# Patient Record
Sex: Male | Born: 1940 | Race: White | Hispanic: No | Smoking: Former smoker
Health system: Southern US, Community
[De-identification: ages and names within clinical notes are randomized; demographics above are authoritative.]

## PROBLEM LIST (undated history)

## (undated) DIAGNOSIS — C8590 Non-Hodgkin lymphoma, unspecified, unspecified site: Secondary | ICD-10-CM

## (undated) DIAGNOSIS — H409 Unspecified glaucoma: Secondary | ICD-10-CM

## (undated) DIAGNOSIS — N529 Male erectile dysfunction, unspecified: Secondary | ICD-10-CM

## (undated) DIAGNOSIS — N401 Enlarged prostate with lower urinary tract symptoms: Secondary | ICD-10-CM

## (undated) DIAGNOSIS — E291 Testicular hypofunction: Secondary | ICD-10-CM

## (undated) DIAGNOSIS — C61 Malignant neoplasm of prostate: Secondary | ICD-10-CM

## (undated) DIAGNOSIS — Z87438 Personal history of other diseases of male genital organs: Secondary | ICD-10-CM

## (undated) HISTORY — PX: PROSTATE BIOPSY: SHX241

## (undated) HISTORY — PX: HERNIA REPAIR: SHX51

## (undated) HISTORY — PX: OTHER SURGICAL HISTORY: SHX169

## (undated) HISTORY — PX: CATARACT EXTRACTION W/ INTRAOCULAR LENS  IMPLANT, BILATERAL: SHX1307

---

## 2005-08-13 ENCOUNTER — Encounter: Admission: RE | Admit: 2005-08-13 | Discharge: 2005-08-13 | Payer: Self-pay | Admitting: Family Medicine

## 2007-08-05 ENCOUNTER — Encounter: Admission: RE | Admit: 2007-08-05 | Discharge: 2007-08-05 | Payer: Self-pay | Admitting: Family Medicine

## 2007-08-07 ENCOUNTER — Ambulatory Visit: Payer: Self-pay | Admitting: Oncology

## 2007-08-11 LAB — COMPREHENSIVE METABOLIC PANEL
ALT: 23 U/L (ref 0–53)
AST: 27 U/L (ref 0–37)
Alkaline Phosphatase: 64 U/L (ref 39–117)
BUN: 15 mg/dL (ref 6–23)
Calcium: 9.6 mg/dL (ref 8.4–10.5)
Chloride: 100 mEq/L (ref 96–112)
Creatinine, Ser: 1.01 mg/dL (ref 0.40–1.50)
Total Bilirubin: 0.5 mg/dL (ref 0.3–1.2)

## 2007-08-11 LAB — CBC WITH DIFFERENTIAL/PLATELET
BASO%: 0.2 % (ref 0.0–2.0)
HCT: 39.3 % (ref 38.7–49.9)
HGB: 13.9 g/dL (ref 13.0–17.1)
MCHC: 35.5 g/dL (ref 32.0–35.9)
MONO#: 0.9 10*3/uL (ref 0.1–0.9)
NEUT%: 66.8 % (ref 40.0–75.0)
WBC: 6.9 10*3/uL (ref 4.0–10.0)
lymph#: 1.2 10*3/uL (ref 0.9–3.3)

## 2007-08-12 ENCOUNTER — Ambulatory Visit (HOSPITAL_COMMUNITY): Admission: RE | Admit: 2007-08-12 | Discharge: 2007-08-12 | Payer: Self-pay | Admitting: Family Medicine

## 2007-08-13 ENCOUNTER — Ambulatory Visit (HOSPITAL_COMMUNITY): Admission: RE | Admit: 2007-08-13 | Discharge: 2007-08-13 | Payer: Self-pay | Admitting: Surgery

## 2007-08-13 ENCOUNTER — Encounter (INDEPENDENT_AMBULATORY_CARE_PROVIDER_SITE_OTHER): Payer: Self-pay | Admitting: Surgery

## 2007-08-13 ENCOUNTER — Encounter (INDEPENDENT_AMBULATORY_CARE_PROVIDER_SITE_OTHER): Payer: Self-pay | Admitting: Interventional Radiology

## 2007-08-20 ENCOUNTER — Encounter (HOSPITAL_COMMUNITY): Payer: Self-pay | Admitting: Oncology

## 2007-08-20 ENCOUNTER — Ambulatory Visit: Admission: RE | Admit: 2007-08-20 | Discharge: 2007-08-20 | Payer: Self-pay | Admitting: Oncology

## 2007-08-20 ENCOUNTER — Ambulatory Visit: Payer: Self-pay | Admitting: Cardiology

## 2007-08-20 HISTORY — PX: TRANSTHORACIC ECHOCARDIOGRAM: SHX275

## 2007-08-22 ENCOUNTER — Ambulatory Visit (HOSPITAL_BASED_OUTPATIENT_CLINIC_OR_DEPARTMENT_OTHER): Admission: RE | Admit: 2007-08-22 | Discharge: 2007-08-22 | Payer: Self-pay | Admitting: Surgery

## 2007-08-26 ENCOUNTER — Other Ambulatory Visit: Admission: RE | Admit: 2007-08-26 | Discharge: 2007-08-26 | Payer: Self-pay | Admitting: Oncology

## 2007-08-26 ENCOUNTER — Encounter (HOSPITAL_COMMUNITY): Payer: Self-pay | Admitting: Oncology

## 2007-08-26 LAB — COMPREHENSIVE METABOLIC PANEL
Albumin: 3.6 g/dL (ref 3.5–5.2)
BUN: 16 mg/dL (ref 6–23)
CO2: 21 mEq/L (ref 19–32)
Glucose, Bld: 130 mg/dL — ABNORMAL HIGH (ref 70–99)
Potassium: 4.3 mEq/L (ref 3.5–5.3)
Sodium: 138 mEq/L (ref 135–145)
Total Protein: 6.4 g/dL (ref 6.0–8.3)

## 2007-08-26 LAB — URIC ACID: Uric Acid, Serum: 8.2 mg/dL — ABNORMAL HIGH (ref 2.4–7.0)

## 2007-08-26 LAB — CBC WITH DIFFERENTIAL/PLATELET
Basophils Absolute: 0 10*3/uL (ref 0.0–0.1)
Eosinophils Absolute: 0.2 10*3/uL (ref 0.0–0.5)
HGB: 13.6 g/dL (ref 13.0–17.1)
LYMPH%: 10.3 % — ABNORMAL LOW (ref 14.0–48.0)
MONO#: 1.4 10*3/uL — ABNORMAL HIGH (ref 0.1–0.9)
NEUT#: 7.9 10*3/uL — ABNORMAL HIGH (ref 1.5–6.5)
Platelets: 354 10*3/uL (ref 145–400)
RBC: 4.37 10*6/uL (ref 4.20–5.71)
WBC: 10.6 10*3/uL — ABNORMAL HIGH (ref 4.0–10.0)

## 2007-08-26 LAB — LACTATE DEHYDROGENASE: LDH: 346 U/L — ABNORMAL HIGH (ref 94–250)

## 2007-09-02 LAB — CBC WITH DIFFERENTIAL/PLATELET
BASO%: 0.1 % (ref 0.0–2.0)
EOS%: 1.7 % (ref 0.0–7.0)
MCH: 30.8 pg (ref 28.0–33.4)
MCHC: 35 g/dL (ref 32.0–35.9)
MCV: 88 fL (ref 81.6–98.0)
MONO%: 0.3 % (ref 0.0–13.0)
NEUT%: 90.2 % — ABNORMAL HIGH (ref 40.0–75.0)
RDW: 13.7 % (ref 11.2–14.6)
lymph#: 0.8 10*3/uL — ABNORMAL LOW (ref 0.9–3.3)

## 2007-09-08 LAB — CBC WITH DIFFERENTIAL/PLATELET
Basophils Absolute: 0 10*3/uL (ref 0.0–0.1)
EOS%: 0.6 % (ref 0.0–7.0)
HGB: 13.1 g/dL (ref 13.0–17.1)
MCH: 31 pg (ref 28.0–33.4)
MCV: 88.4 fL (ref 81.6–98.0)
MONO%: 1.5 % (ref 0.0–13.0)
NEUT#: 13.3 10*3/uL — ABNORMAL HIGH (ref 1.5–6.5)
RBC: 4.21 10*6/uL (ref 4.20–5.71)
RDW: 14.1 % (ref 11.2–14.6)
lymph#: 3 10*3/uL (ref 0.9–3.3)

## 2007-09-08 LAB — COMPREHENSIVE METABOLIC PANEL
ALT: 18 U/L (ref 0–53)
AST: 17 U/L (ref 0–37)
Albumin: 3.7 g/dL (ref 3.5–5.2)
Alkaline Phosphatase: 84 U/L (ref 39–117)
BUN: 11 mg/dL (ref 6–23)
Calcium: 9.5 mg/dL (ref 8.4–10.5)
Chloride: 102 mEq/L (ref 96–112)
Potassium: 3.8 mEq/L (ref 3.5–5.3)
Sodium: 139 mEq/L (ref 135–145)
Total Protein: 6.2 g/dL (ref 6.0–8.3)

## 2007-09-16 LAB — CBC WITH DIFFERENTIAL/PLATELET
Basophils Absolute: 0 10*3/uL (ref 0.0–0.1)
Eosinophils Absolute: 0 10*3/uL (ref 0.0–0.5)
HGB: 11.8 g/dL — ABNORMAL LOW (ref 13.0–17.1)
LYMPH%: 12.5 % — ABNORMAL LOW (ref 14.0–48.0)
MCV: 87.5 fL (ref 81.6–98.0)
MONO%: 2 % (ref 0.0–13.0)
NEUT#: 3.2 10*3/uL (ref 1.5–6.5)
Platelets: 120 10*3/uL — ABNORMAL LOW (ref 145–400)

## 2007-09-19 ENCOUNTER — Ambulatory Visit: Payer: Self-pay | Admitting: Oncology

## 2007-09-22 LAB — CBC WITH DIFFERENTIAL/PLATELET
Eosinophils Absolute: 0.1 10*3/uL (ref 0.0–0.5)
LYMPH%: 9.3 % — ABNORMAL LOW (ref 14.0–48.0)
MCHC: 34.5 g/dL (ref 32.0–35.9)
MCV: 88 fL (ref 81.6–98.0)
MONO%: 12.8 % (ref 0.0–13.0)
NEUT#: 7 10*3/uL — ABNORMAL HIGH (ref 1.5–6.5)
NEUT%: 77 % — ABNORMAL HIGH (ref 40.0–75.0)
Platelets: 230 10*3/uL (ref 145–400)
RBC: 4.1 10*6/uL — ABNORMAL LOW (ref 4.20–5.71)

## 2007-09-22 LAB — COMPREHENSIVE METABOLIC PANEL
Alkaline Phosphatase: 68 U/L (ref 39–117)
BUN: 10 mg/dL (ref 6–23)
Creatinine, Ser: 0.82 mg/dL (ref 0.40–1.50)
Glucose, Bld: 106 mg/dL — ABNORMAL HIGH (ref 70–99)
Sodium: 141 mEq/L (ref 135–145)
Total Bilirubin: 0.3 mg/dL (ref 0.3–1.2)
Total Protein: 6.1 g/dL (ref 6.0–8.3)

## 2007-09-22 LAB — URIC ACID: Uric Acid, Serum: 4.3 mg/dL (ref 2.4–7.0)

## 2007-09-30 LAB — CBC WITH DIFFERENTIAL/PLATELET
BASO%: 1 % (ref 0.0–2.0)
LYMPH%: 6.4 % — ABNORMAL LOW (ref 14.0–48.0)
MCHC: 34.9 g/dL (ref 32.0–35.9)
MCV: 87.6 fL (ref 81.6–98.0)
MONO%: 1.6 % (ref 0.0–13.0)
Platelets: 158 10*3/uL (ref 145–400)
RBC: 3.66 10*6/uL — ABNORMAL LOW (ref 4.20–5.71)
RDW: 15.5 % — ABNORMAL HIGH (ref 11.2–14.6)
WBC: 6.9 10*3/uL (ref 4.0–10.0)

## 2007-10-06 LAB — COMPREHENSIVE METABOLIC PANEL
ALT: 19 U/L (ref 0–53)
Albumin: 4 g/dL (ref 3.5–5.2)
CO2: 27 mEq/L (ref 19–32)
Chloride: 104 mEq/L (ref 96–112)
Potassium: 3.8 mEq/L (ref 3.5–5.3)
Sodium: 141 mEq/L (ref 135–145)
Total Bilirubin: 0.2 mg/dL — ABNORMAL LOW (ref 0.3–1.2)
Total Protein: 6.3 g/dL (ref 6.0–8.3)

## 2007-10-06 LAB — CBC WITH DIFFERENTIAL/PLATELET
BASO%: 0.4 % (ref 0.0–2.0)
Eosinophils Absolute: 0.1 10*3/uL (ref 0.0–0.5)
MCHC: 34.5 g/dL (ref 32.0–35.9)
MONO#: 1.9 10*3/uL — ABNORMAL HIGH (ref 0.1–0.9)
NEUT#: 12.2 10*3/uL — ABNORMAL HIGH (ref 1.5–6.5)
RBC: 4.09 10*6/uL — ABNORMAL LOW (ref 4.20–5.71)
RDW: 16.6 % — ABNORMAL HIGH (ref 11.2–14.6)
WBC: 14.9 10*3/uL — ABNORMAL HIGH (ref 4.0–10.0)
lymph#: 0.7 10*3/uL — ABNORMAL LOW (ref 0.9–3.3)

## 2007-10-06 LAB — LACTATE DEHYDROGENASE: LDH: 192 U/L (ref 94–250)

## 2007-10-13 LAB — CBC WITH DIFFERENTIAL/PLATELET
BASO%: 0.1 % (ref 0.0–2.0)
EOS%: 0.2 % (ref 0.0–7.0)
HCT: 34 % — ABNORMAL LOW (ref 38.7–49.9)
LYMPH%: 2.9 % — ABNORMAL LOW (ref 14.0–48.0)
MCH: 30.5 pg (ref 28.0–33.4)
MCHC: 34.9 g/dL (ref 32.0–35.9)
NEUT%: 96.3 % — ABNORMAL HIGH (ref 40.0–75.0)
RBC: 3.89 10*6/uL — ABNORMAL LOW (ref 4.20–5.71)
lymph#: 0.4 10*3/uL — ABNORMAL LOW (ref 0.9–3.3)

## 2007-10-15 ENCOUNTER — Ambulatory Visit (HOSPITAL_COMMUNITY): Admission: RE | Admit: 2007-10-15 | Discharge: 2007-10-15 | Payer: Self-pay | Admitting: Oncology

## 2007-10-20 LAB — CBC WITH DIFFERENTIAL/PLATELET
EOS%: 0.4 % (ref 0.0–7.0)
MCH: 29.7 pg (ref 28.0–33.4)
MCHC: 33.6 g/dL (ref 32.0–35.9)
MCV: 88.3 fL (ref 81.6–98.0)
MONO%: 14.5 % — ABNORMAL HIGH (ref 0.0–13.0)
RBC: 4.3 10*6/uL (ref 4.20–5.71)
RDW: 17.6 % — ABNORMAL HIGH (ref 11.2–14.6)

## 2007-10-20 LAB — COMPREHENSIVE METABOLIC PANEL
AST: 16 U/L (ref 0–37)
Albumin: 3.9 g/dL (ref 3.5–5.2)
Alkaline Phosphatase: 87 U/L (ref 39–117)
BUN: 10 mg/dL (ref 6–23)
Potassium: 3.9 mEq/L (ref 3.5–5.3)
Sodium: 140 mEq/L (ref 135–145)

## 2007-10-29 LAB — CBC WITH DIFFERENTIAL/PLATELET
Basophils Absolute: 0 10*3/uL (ref 0.0–0.1)
Eosinophils Absolute: 0.1 10*3/uL (ref 0.0–0.5)
LYMPH%: 3.5 % — ABNORMAL LOW (ref 14.0–48.0)
MCV: 88 fL (ref 81.6–98.0)
MONO%: 1.2 % (ref 0.0–13.0)
NEUT#: 9.3 10*3/uL — ABNORMAL HIGH (ref 1.5–6.5)
NEUT%: 94.1 % — ABNORMAL HIGH (ref 40.0–75.0)
Platelets: 174 10*3/uL (ref 145–400)
RBC: 3.88 10*6/uL — ABNORMAL LOW (ref 4.20–5.71)

## 2007-11-03 ENCOUNTER — Ambulatory Visit: Payer: Self-pay | Admitting: Oncology

## 2007-11-03 LAB — CBC WITH DIFFERENTIAL/PLATELET
Basophils Absolute: 0.1 10*3/uL (ref 0.0–0.1)
EOS%: 1.3 % (ref 0.0–7.0)
Eosinophils Absolute: 0.1 10*3/uL (ref 0.0–0.5)
HGB: 13.2 g/dL (ref 13.0–17.1)
LYMPH%: 10.4 % — ABNORMAL LOW (ref 14.0–48.0)
MCH: 29 pg (ref 28.0–33.4)
MCV: 88.6 fL (ref 81.6–98.0)
MONO%: 22.5 % — ABNORMAL HIGH (ref 0.0–13.0)
NEUT#: 5.6 10*3/uL (ref 1.5–6.5)
NEUT%: 65 % (ref 40.0–75.0)
Platelets: 210 10*3/uL (ref 145–400)
RDW: 17.1 % — ABNORMAL HIGH (ref 11.2–14.6)

## 2007-11-03 LAB — COMPREHENSIVE METABOLIC PANEL
ALT: 17 U/L (ref 0–53)
AST: 16 U/L (ref 0–37)
Albumin: 3.9 g/dL (ref 3.5–5.2)
Alkaline Phosphatase: 84 U/L (ref 39–117)
BUN: 12 mg/dL (ref 6–23)
CO2: 24 mEq/L (ref 19–32)
Calcium: 9 mg/dL (ref 8.4–10.5)
Chloride: 108 mEq/L (ref 96–112)
Creatinine, Ser: 0.9 mg/dL (ref 0.40–1.50)
Glucose, Bld: 123 mg/dL — ABNORMAL HIGH (ref 70–99)
Potassium: 4.2 mEq/L (ref 3.5–5.3)
Sodium: 143 mEq/L (ref 135–145)
Total Bilirubin: 0.3 mg/dL (ref 0.3–1.2)
Total Protein: 6.1 g/dL (ref 6.0–8.3)

## 2007-11-12 LAB — CBC WITH DIFFERENTIAL/PLATELET
Basophils Absolute: 0 10*3/uL (ref 0.0–0.1)
Eosinophils Absolute: 0.1 10*3/uL (ref 0.0–0.5)
HCT: 32.8 % — ABNORMAL LOW (ref 38.7–49.9)
HGB: 11.4 g/dL — ABNORMAL LOW (ref 13.0–17.1)
LYMPH%: 6.1 % — ABNORMAL LOW (ref 14.0–48.0)
MCV: 88.4 fL (ref 81.6–98.0)
MONO#: 0.1 10*3/uL (ref 0.1–0.9)
MONO%: 1.8 % (ref 0.0–13.0)
NEUT#: 7.4 10*3/uL — ABNORMAL HIGH (ref 1.5–6.5)
Platelets: 128 10*3/uL — ABNORMAL LOW (ref 145–400)
RBC: 3.72 10*6/uL — ABNORMAL LOW (ref 4.20–5.71)
WBC: 8.2 10*3/uL (ref 4.0–10.0)

## 2007-11-19 LAB — CBC WITH DIFFERENTIAL/PLATELET
BASO%: 0.6 % (ref 0.0–2.0)
Eosinophils Absolute: 0.1 10*3/uL (ref 0.0–0.5)
HCT: 37.9 % — ABNORMAL LOW (ref 38.7–49.9)
LYMPH%: 9.2 % — ABNORMAL LOW (ref 14.0–48.0)
MCHC: 35 g/dL (ref 32.0–35.9)
MCV: 88.4 fL (ref 81.6–98.0)
MONO#: 1.2 10*3/uL — ABNORMAL HIGH (ref 0.1–0.9)
MONO%: 13.1 % — ABNORMAL HIGH (ref 0.0–13.0)
NEUT%: 76.5 % — ABNORMAL HIGH (ref 40.0–75.0)
Platelets: 174 10*3/uL (ref 145–400)
WBC: 9.4 10*3/uL (ref 4.0–10.0)

## 2007-11-27 LAB — CBC WITH DIFFERENTIAL/PLATELET
Basophils Absolute: 0.1 10*3/uL (ref 0.0–0.1)
Eosinophils Absolute: 0 10*3/uL (ref 0.0–0.5)
HCT: 36.2 % — ABNORMAL LOW (ref 38.7–49.9)
LYMPH%: 7.2 % — ABNORMAL LOW (ref 14.0–48.0)
MONO#: 0.9 10*3/uL (ref 0.1–0.9)
NEUT#: 7.2 10*3/uL — ABNORMAL HIGH (ref 1.5–6.5)
NEUT%: 81.1 % — ABNORMAL HIGH (ref 40.0–75.0)
Platelets: 169 10*3/uL (ref 145–400)
WBC: 8.8 10*3/uL (ref 4.0–10.0)

## 2007-11-27 LAB — COMPREHENSIVE METABOLIC PANEL
BUN: 17 mg/dL (ref 6–23)
CO2: 24 mEq/L (ref 19–32)
Creatinine, Ser: 0.95 mg/dL (ref 0.40–1.50)
Glucose, Bld: 152 mg/dL — ABNORMAL HIGH (ref 70–99)
Total Bilirubin: 0.4 mg/dL (ref 0.3–1.2)
Total Protein: 5.9 g/dL — ABNORMAL LOW (ref 6.0–8.3)

## 2007-11-27 LAB — LACTATE DEHYDROGENASE: LDH: 168 U/L (ref 94–250)

## 2007-12-03 ENCOUNTER — Ambulatory Visit (HOSPITAL_COMMUNITY): Admission: RE | Admit: 2007-12-03 | Discharge: 2007-12-03 | Payer: Self-pay | Admitting: Oncology

## 2007-12-19 ENCOUNTER — Ambulatory Visit: Payer: Self-pay | Admitting: Oncology

## 2007-12-23 LAB — COMPREHENSIVE METABOLIC PANEL
Albumin: 4.3 g/dL (ref 3.5–5.2)
BUN: 20 mg/dL (ref 6–23)
CO2: 23 mEq/L (ref 19–32)
Calcium: 9.2 mg/dL (ref 8.4–10.5)
Chloride: 108 mEq/L (ref 96–112)
Creatinine, Ser: 1.03 mg/dL (ref 0.40–1.50)
Glucose, Bld: 139 mg/dL — ABNORMAL HIGH (ref 70–99)
Potassium: 4.3 mEq/L (ref 3.5–5.3)

## 2007-12-23 LAB — CBC WITH DIFFERENTIAL/PLATELET
Basophils Absolute: 0.1 10*3/uL (ref 0.0–0.1)
Eosinophils Absolute: 0.3 10*3/uL (ref 0.0–0.5)
HCT: 39.2 % (ref 38.7–49.9)
HGB: 13.8 g/dL (ref 13.0–17.1)
MCH: 31.6 pg (ref 28.0–33.4)
NEUT#: 3.1 10*3/uL (ref 1.5–6.5)
NEUT%: 64 % (ref 40.0–75.0)
RDW: 14.4 % (ref 11.2–14.6)
lymph#: 0.8 10*3/uL — ABNORMAL LOW (ref 0.9–3.3)

## 2008-01-16 LAB — CBC WITH DIFFERENTIAL/PLATELET
BASO%: 1.2 % (ref 0.0–2.0)
Basophils Absolute: 0.1 10*3/uL (ref 0.0–0.1)
EOS%: 5 % (ref 0.0–7.0)
Eosinophils Absolute: 0.2 10*3/uL (ref 0.0–0.5)
HCT: 39.3 % (ref 38.7–49.9)
HGB: 14 g/dL (ref 13.0–17.1)
LYMPH%: 15.9 % (ref 14.0–48.0)
MCH: 31.7 pg (ref 28.0–33.4)
MCHC: 35.5 g/dL (ref 32.0–35.9)
MCV: 89.2 fL (ref 81.6–98.0)
MONO#: 0.6 10*3/uL (ref 0.1–0.9)
MONO%: 13.7 % — ABNORMAL HIGH (ref 0.0–13.0)
NEUT#: 2.8 10*3/uL (ref 1.5–6.5)
NEUT%: 64.2 % (ref 40.0–75.0)
Platelets: 146 10*3/uL (ref 145–400)
RBC: 4.41 10*6/uL (ref 4.20–5.71)
RDW: 13.6 % (ref 11.2–14.6)
WBC: 4.3 10*3/uL (ref 4.0–10.0)
lymph#: 0.7 10*3/uL — ABNORMAL LOW (ref 0.9–3.3)

## 2008-02-05 ENCOUNTER — Ambulatory Visit: Payer: Self-pay | Admitting: Oncology

## 2008-02-09 LAB — CBC WITH DIFFERENTIAL/PLATELET
BASO%: 1.3 % (ref 0.0–2.0)
EOS%: 2.9 % (ref 0.0–7.0)
HCT: 40.3 % (ref 38.7–49.9)
LYMPH%: 13.7 % — ABNORMAL LOW (ref 14.0–48.0)
MCH: 31.3 pg (ref 28.0–33.4)
MCHC: 35.9 g/dL (ref 32.0–35.9)
MONO%: 10.8 % (ref 0.0–13.0)
NEUT%: 71.4 % (ref 40.0–75.0)
lymph#: 0.9 10*3/uL (ref 0.9–3.3)

## 2008-02-09 LAB — COMPREHENSIVE METABOLIC PANEL
CO2: 25 mEq/L (ref 19–32)
Calcium: 9.6 mg/dL (ref 8.4–10.5)
Chloride: 107 mEq/L (ref 96–112)
Creatinine, Ser: 0.96 mg/dL (ref 0.40–1.50)
Glucose, Bld: 109 mg/dL — ABNORMAL HIGH (ref 70–99)
Total Bilirubin: 0.6 mg/dL (ref 0.3–1.2)

## 2008-02-09 LAB — LACTATE DEHYDROGENASE: LDH: 141 U/L (ref 94–250)

## 2008-05-16 ENCOUNTER — Ambulatory Visit: Payer: Self-pay | Admitting: Oncology

## 2008-05-19 ENCOUNTER — Ambulatory Visit (HOSPITAL_COMMUNITY): Admission: RE | Admit: 2008-05-19 | Discharge: 2008-05-19 | Payer: Self-pay | Admitting: Oncology

## 2008-05-20 LAB — CBC WITH DIFFERENTIAL/PLATELET
BASO%: 0.7 % (ref 0.0–2.0)
LYMPH%: 15.1 % (ref 14.0–48.0)
MCHC: 34.8 g/dL (ref 32.0–35.9)
MONO#: 0.5 10*3/uL (ref 0.1–0.9)
RBC: 4.38 10*6/uL (ref 4.20–5.71)
RDW: 13.1 % (ref 11.2–14.6)
WBC: 5.2 10*3/uL (ref 4.0–10.0)
lymph#: 0.8 10*3/uL — ABNORMAL LOW (ref 0.9–3.3)

## 2008-05-20 LAB — COMPREHENSIVE METABOLIC PANEL
ALT: 14 U/L (ref 0–53)
CO2: 24 mEq/L (ref 19–32)
Chloride: 109 mEq/L (ref 96–112)
Sodium: 144 mEq/L (ref 135–145)
Total Bilirubin: 0.4 mg/dL (ref 0.3–1.2)
Total Protein: 6.2 g/dL (ref 6.0–8.3)

## 2008-05-20 LAB — LACTATE DEHYDROGENASE: LDH: 124 U/L (ref 94–250)

## 2008-07-21 ENCOUNTER — Ambulatory Visit: Payer: Self-pay | Admitting: Oncology

## 2008-07-23 LAB — COMPREHENSIVE METABOLIC PANEL
Albumin: 4.3 g/dL (ref 3.5–5.2)
CO2: 25 mEq/L (ref 19–32)
Calcium: 9.1 mg/dL (ref 8.4–10.5)
Glucose, Bld: 104 mg/dL — ABNORMAL HIGH (ref 70–99)
Potassium: 4.1 mEq/L (ref 3.5–5.3)
Sodium: 143 mEq/L (ref 135–145)
Total Protein: 6.4 g/dL (ref 6.0–8.3)

## 2008-07-23 LAB — CBC WITH DIFFERENTIAL/PLATELET
Basophils Absolute: 0 10*3/uL (ref 0.0–0.1)
Eosinophils Absolute: 0.3 10*3/uL (ref 0.0–0.5)
HGB: 14.8 g/dL (ref 13.0–17.1)
MONO#: 0.7 10*3/uL (ref 0.1–0.9)
NEUT#: 5.8 10*3/uL (ref 1.5–6.5)
Platelets: 156 10*3/uL (ref 145–400)
RBC: 4.61 10*6/uL (ref 4.20–5.71)
RDW: 12.8 % (ref 11.2–14.6)
WBC: 7.8 10*3/uL (ref 4.0–10.0)

## 2008-07-23 LAB — LACTATE DEHYDROGENASE: LDH: 143 U/L (ref 94–250)

## 2008-09-14 ENCOUNTER — Ambulatory Visit: Payer: Self-pay | Admitting: Oncology

## 2008-09-16 LAB — CBC WITH DIFFERENTIAL/PLATELET
Basophils Absolute: 0 10*3/uL (ref 0.0–0.1)
Eosinophils Absolute: 0.2 10*3/uL (ref 0.0–0.5)
HCT: 42.7 % (ref 38.7–49.9)
HGB: 15 g/dL (ref 13.0–17.1)
LYMPH%: 20.7 % (ref 14.0–48.0)
MCH: 32 pg (ref 28.0–33.4)
MCV: 90.8 fL (ref 81.6–98.0)
MONO%: 13.4 % — ABNORMAL HIGH (ref 0.0–13.0)
NEUT#: 3.2 10*3/uL (ref 1.5–6.5)
NEUT%: 60.9 % (ref 40.0–75.0)
Platelets: 157 10*3/uL (ref 145–400)
RDW: 13.5 % (ref 11.2–14.6)

## 2008-09-17 LAB — COMPREHENSIVE METABOLIC PANEL
Albumin: 4.4 g/dL (ref 3.5–5.2)
Alkaline Phosphatase: 65 U/L (ref 39–117)
BUN: 17 mg/dL (ref 6–23)
Calcium: 9.4 mg/dL (ref 8.4–10.5)
Creatinine, Ser: 1 mg/dL (ref 0.40–1.50)
Glucose, Bld: 107 mg/dL — ABNORMAL HIGH (ref 70–99)
Potassium: 4.4 mEq/L (ref 3.5–5.3)

## 2008-11-16 ENCOUNTER — Ambulatory Visit: Payer: Self-pay | Admitting: Oncology

## 2008-11-18 LAB — COMPREHENSIVE METABOLIC PANEL
ALT: 19 U/L (ref 0–53)
AST: 22 U/L (ref 0–37)
Alkaline Phosphatase: 65 U/L (ref 39–117)
BUN: 20 mg/dL (ref 6–23)
Creatinine, Ser: 1.07 mg/dL (ref 0.40–1.50)
Total Bilirubin: 0.5 mg/dL (ref 0.3–1.2)

## 2008-11-18 LAB — CBC WITH DIFFERENTIAL/PLATELET
BASO%: 0.7 % (ref 0.0–2.0)
Basophils Absolute: 0 10*3/uL (ref 0.0–0.1)
EOS%: 5.8 % (ref 0.0–7.0)
HCT: 42.9 % (ref 38.7–49.9)
HGB: 14.9 g/dL (ref 13.0–17.1)
MCH: 32 pg (ref 28.0–33.4)
MCHC: 34.8 g/dL (ref 32.0–35.9)
MCV: 92 fL (ref 81.6–98.0)
MONO%: 9.6 % (ref 0.0–13.0)
NEUT%: 65.8 % (ref 40.0–75.0)
lymph#: 1.1 10*3/uL (ref 0.9–3.3)

## 2009-01-12 ENCOUNTER — Ambulatory Visit: Payer: Self-pay | Admitting: Oncology

## 2009-01-14 LAB — CBC WITH DIFFERENTIAL/PLATELET
Basophils Absolute: 0 10*3/uL (ref 0.0–0.1)
Eosinophils Absolute: 0.2 10*3/uL (ref 0.0–0.5)
HCT: 43 % (ref 38.4–49.9)
HGB: 14.9 g/dL (ref 13.0–17.1)
LYMPH%: 20.4 % (ref 14.0–49.0)
MCV: 92.2 fL (ref 79.3–98.0)
MONO%: 11.5 % (ref 0.0–14.0)
NEUT#: 3.1 10*3/uL (ref 1.5–6.5)
NEUT%: 63.4 % (ref 39.0–75.0)
Platelets: 165 10*3/uL (ref 140–400)

## 2009-01-14 LAB — COMPREHENSIVE METABOLIC PANEL
ALT: 24 U/L (ref 0–53)
AST: 23 U/L (ref 0–37)
CO2: 26 mEq/L (ref 19–32)
Calcium: 9.4 mg/dL (ref 8.4–10.5)
Chloride: 107 mEq/L (ref 96–112)
Creatinine, Ser: 1.07 mg/dL (ref 0.40–1.50)
Potassium: 4.6 mEq/L (ref 3.5–5.3)
Total Bilirubin: 0.5 mg/dL (ref 0.3–1.2)
Total Protein: 6.6 g/dL (ref 6.0–8.3)

## 2009-01-14 LAB — LACTATE DEHYDROGENASE: LDH: 115 U/L (ref 94–250)

## 2009-02-04 ENCOUNTER — Ambulatory Visit (HOSPITAL_COMMUNITY): Admission: RE | Admit: 2009-02-04 | Discharge: 2009-02-04 | Payer: Self-pay | Admitting: Oncology

## 2009-05-20 ENCOUNTER — Ambulatory Visit: Payer: Self-pay | Admitting: Oncology

## 2009-05-24 LAB — CBC WITH DIFFERENTIAL/PLATELET
BASO%: 0.8 % (ref 0.0–2.0)
Basophils Absolute: 0 10*3/uL (ref 0.0–0.1)
EOS%: 6.4 % (ref 0.0–7.0)
HCT: 41.7 % (ref 38.4–49.9)
HGB: 14.5 g/dL (ref 13.0–17.1)
MCH: 31.9 pg (ref 27.2–33.4)
MCHC: 34.8 g/dL (ref 32.0–36.0)
MCV: 91.7 fL (ref 79.3–98.0)
MONO%: 10.4 % (ref 0.0–14.0)
NEUT%: 59 % (ref 39.0–75.0)
RDW: 13.1 % (ref 11.0–14.6)

## 2009-05-24 LAB — COMPREHENSIVE METABOLIC PANEL
AST: 16 U/L (ref 0–37)
Alkaline Phosphatase: 59 U/L (ref 39–117)
BUN: 19 mg/dL (ref 6–23)
Creatinine, Ser: 1.11 mg/dL (ref 0.40–1.50)

## 2009-07-14 ENCOUNTER — Ambulatory Visit: Payer: Self-pay | Admitting: Oncology

## 2009-09-01 ENCOUNTER — Ambulatory Visit: Payer: Self-pay | Admitting: Oncology

## 2009-09-05 ENCOUNTER — Ambulatory Visit (HOSPITAL_COMMUNITY): Admission: RE | Admit: 2009-09-05 | Discharge: 2009-09-05 | Payer: Self-pay | Admitting: Oncology

## 2009-09-05 LAB — CBC WITH DIFFERENTIAL/PLATELET
BASO%: 1 % (ref 0.0–2.0)
EOS%: 5.3 % (ref 0.0–7.0)
HCT: 42.8 % (ref 38.4–49.9)
MCH: 32.7 pg (ref 27.2–33.4)
MCHC: 34.9 g/dL (ref 32.0–36.0)
MONO#: 0.8 10*3/uL (ref 0.1–0.9)
NEUT%: 58 % (ref 39.0–75.0)
RBC: 4.57 10*6/uL (ref 4.20–5.82)
RDW: 13.1 % (ref 11.0–14.6)
WBC: 6.4 10*3/uL (ref 4.0–10.3)
lymph#: 1.5 10*3/uL (ref 0.9–3.3)

## 2009-09-05 LAB — COMPREHENSIVE METABOLIC PANEL
ALT: 20 U/L (ref 0–53)
AST: 23 U/L (ref 0–37)
CO2: 24 mEq/L (ref 19–32)
Calcium: 9.1 mg/dL (ref 8.4–10.5)
Chloride: 104 mEq/L (ref 96–112)
Creatinine, Ser: 1.07 mg/dL (ref 0.40–1.50)
Sodium: 138 mEq/L (ref 135–145)
Total Protein: 6.6 g/dL (ref 6.0–8.3)

## 2009-11-03 ENCOUNTER — Ambulatory Visit: Payer: Self-pay | Admitting: Oncology

## 2009-12-29 ENCOUNTER — Ambulatory Visit: Payer: Self-pay | Admitting: Oncology

## 2010-01-02 LAB — COMPREHENSIVE METABOLIC PANEL
ALT: 21 U/L (ref 0–53)
AST: 18 U/L (ref 0–37)
Albumin: 4.4 g/dL (ref 3.5–5.2)
Alkaline Phosphatase: 56 U/L (ref 39–117)
BUN: 14 mg/dL (ref 6–23)
CO2: 23 mEq/L (ref 19–32)
Calcium: 9.2 mg/dL (ref 8.4–10.5)
Chloride: 108 mEq/L (ref 96–112)
Creatinine, Ser: 1.2 mg/dL (ref 0.40–1.50)
Glucose, Bld: 101 mg/dL — ABNORMAL HIGH (ref 70–99)
Potassium: 4 mEq/L (ref 3.5–5.3)
Sodium: 141 mEq/L (ref 135–145)
Total Bilirubin: 0.5 mg/dL (ref 0.3–1.2)
Total Protein: 6.4 g/dL (ref 6.0–8.3)

## 2010-01-02 LAB — CBC WITH DIFFERENTIAL/PLATELET
BASO%: 0.7 % (ref 0.0–2.0)
Basophils Absolute: 0 10*3/uL (ref 0.0–0.1)
EOS%: 4.8 % (ref 0.0–7.0)
Eosinophils Absolute: 0.3 10*3/uL (ref 0.0–0.5)
HCT: 43.8 % (ref 38.4–49.9)
HGB: 15.2 g/dL (ref 13.0–17.1)
LYMPH%: 21.6 % (ref 14.0–49.0)
MCH: 32.3 pg (ref 27.2–33.4)
MCHC: 34.7 g/dL (ref 32.0–36.0)
MCV: 93.2 fL (ref 79.3–98.0)
MONO#: 0.6 10*3/uL (ref 0.1–0.9)
MONO%: 10 % (ref 0.0–14.0)
NEUT#: 3.7 10*3/uL (ref 1.5–6.5)
NEUT%: 62.9 % (ref 39.0–75.0)
Platelets: 165 10*3/uL (ref 140–400)
RBC: 4.7 10*6/uL (ref 4.20–5.82)
RDW: 13.2 % (ref 11.0–14.6)
WBC: 5.8 10*3/uL (ref 4.0–10.3)
lymph#: 1.3 10*3/uL (ref 0.9–3.3)

## 2010-01-02 LAB — LACTATE DEHYDROGENASE: LDH: 124 U/L (ref 94–250)

## 2010-02-03 ENCOUNTER — Ambulatory Visit (HOSPITAL_COMMUNITY): Admission: RE | Admit: 2010-02-03 | Discharge: 2010-02-03 | Payer: Self-pay | Admitting: Oncology

## 2010-06-22 ENCOUNTER — Ambulatory Visit (HOSPITAL_BASED_OUTPATIENT_CLINIC_OR_DEPARTMENT_OTHER): Admission: RE | Admit: 2010-06-22 | Discharge: 2010-06-22 | Payer: Self-pay | Admitting: Surgery

## 2010-07-04 ENCOUNTER — Ambulatory Visit: Payer: Self-pay | Admitting: Oncology

## 2010-07-13 LAB — COMPREHENSIVE METABOLIC PANEL WITH GFR
ALT: 16 U/L (ref 0–53)
AST: 17 U/L (ref 0–37)
Albumin: 4.3 g/dL (ref 3.5–5.2)
Alkaline Phosphatase: 59 U/L (ref 39–117)
BUN: 19 mg/dL (ref 6–23)
CO2: 23 meq/L (ref 19–32)
Calcium: 9.4 mg/dL (ref 8.4–10.5)
Chloride: 108 meq/L (ref 96–112)
Creatinine, Ser: 1.01 mg/dL (ref 0.40–1.50)
Glucose, Bld: 96 mg/dL (ref 70–99)
Potassium: 4.4 meq/L (ref 3.5–5.3)
Sodium: 141 meq/L (ref 135–145)
Total Bilirubin: 0.5 mg/dL (ref 0.3–1.2)
Total Protein: 6.2 g/dL (ref 6.0–8.3)

## 2010-07-13 LAB — CBC WITH DIFFERENTIAL/PLATELET
BASO%: 0.7 % (ref 0.0–2.0)
Basophils Absolute: 0 10*3/uL (ref 0.0–0.1)
EOS%: 4 % (ref 0.0–7.0)
Eosinophils Absolute: 0.2 10*3/uL (ref 0.0–0.5)
HCT: 43.4 % (ref 38.4–49.9)
HGB: 14.7 g/dL (ref 13.0–17.1)
LYMPH%: 20.7 % (ref 14.0–49.0)
MCH: 31.7 pg (ref 27.2–33.4)
MCHC: 34 g/dL (ref 32.0–36.0)
MCV: 93.3 fL (ref 79.3–98.0)
MONO#: 0.6 10*3/uL (ref 0.1–0.9)
MONO%: 10.8 % (ref 0.0–14.0)
NEUT#: 3.6 10*3/uL (ref 1.5–6.5)
NEUT%: 63.8 % (ref 39.0–75.0)
Platelets: 170 10*3/uL (ref 140–400)
RBC: 4.65 10*6/uL (ref 4.20–5.82)
RDW: 13 % (ref 11.0–14.6)
WBC: 5.7 10*3/uL (ref 4.0–10.3)
lymph#: 1.2 10*3/uL (ref 0.9–3.3)

## 2010-07-13 LAB — LACTATE DEHYDROGENASE: LDH: 122 U/L (ref 94–250)

## 2010-10-22 ENCOUNTER — Encounter (HOSPITAL_COMMUNITY): Payer: Self-pay | Admitting: Oncology

## 2010-10-23 ENCOUNTER — Encounter (HOSPITAL_COMMUNITY): Payer: Self-pay | Admitting: Oncology

## 2011-01-09 LAB — GLUCOSE, CAPILLARY: Glucose-Capillary: 114 mg/dL — ABNORMAL HIGH (ref 70–99)

## 2011-01-23 ENCOUNTER — Other Ambulatory Visit: Payer: Self-pay | Admitting: Internal Medicine

## 2011-01-25 ENCOUNTER — Other Ambulatory Visit (HOSPITAL_COMMUNITY): Payer: Self-pay | Admitting: Oncology

## 2011-01-25 ENCOUNTER — Encounter (HOSPITAL_BASED_OUTPATIENT_CLINIC_OR_DEPARTMENT_OTHER): Payer: BC Managed Care – PPO | Admitting: Oncology

## 2011-01-25 DIAGNOSIS — C8589 Other specified types of non-Hodgkin lymphoma, extranodal and solid organ sites: Secondary | ICD-10-CM

## 2011-01-25 DIAGNOSIS — C8583 Other specified types of non-Hodgkin lymphoma, intra-abdominal lymph nodes: Secondary | ICD-10-CM

## 2011-01-25 DIAGNOSIS — C8590 Non-Hodgkin lymphoma, unspecified, unspecified site: Secondary | ICD-10-CM

## 2011-01-25 LAB — CBC WITH DIFFERENTIAL/PLATELET
BASO%: 0.6 % (ref 0.0–2.0)
Basophils Absolute: 0 10*3/uL (ref 0.0–0.1)
EOS%: 3.4 % (ref 0.0–7.0)
HCT: 45.1 % (ref 38.4–49.9)
HGB: 15.4 g/dL (ref 13.0–17.1)
MCH: 31.9 pg (ref 27.2–33.4)
MCHC: 34.2 g/dL (ref 32.0–36.0)
MONO#: 0.6 10*3/uL (ref 0.1–0.9)
NEUT%: 61.2 % (ref 39.0–75.0)
RDW: 13.4 % (ref 11.0–14.6)
WBC: 6 10*3/uL (ref 4.0–10.3)
lymph#: 1.5 10*3/uL (ref 0.9–3.3)

## 2011-01-25 LAB — COMPREHENSIVE METABOLIC PANEL
ALT: 24 U/L (ref 0–53)
AST: 24 U/L (ref 0–37)
Albumin: 4.4 g/dL (ref 3.5–5.2)
CO2: 23 mEq/L (ref 19–32)
Calcium: 9.7 mg/dL (ref 8.4–10.5)
Chloride: 105 mEq/L (ref 96–112)
Potassium: 4.6 mEq/L (ref 3.5–5.3)
Total Protein: 6.6 g/dL (ref 6.0–8.3)

## 2011-01-25 LAB — LACTATE DEHYDROGENASE: LDH: 137 U/L (ref 94–250)

## 2011-02-02 ENCOUNTER — Ambulatory Visit (HOSPITAL_COMMUNITY)
Admission: RE | Admit: 2011-02-02 | Discharge: 2011-02-02 | Disposition: A | Discharge status: Home / Self Care | Payer: BC Managed Care – PPO | Source: Ambulatory Visit | Attending: Oncology | Admitting: Oncology

## 2011-02-02 DIAGNOSIS — R197 Diarrhea, unspecified: Secondary | ICD-10-CM | POA: Insufficient documentation

## 2011-02-02 DIAGNOSIS — N400 Enlarged prostate without lower urinary tract symptoms: Secondary | ICD-10-CM | POA: Insufficient documentation

## 2011-02-02 DIAGNOSIS — C8589 Other specified types of non-Hodgkin lymphoma, extranodal and solid organ sites: Secondary | ICD-10-CM | POA: Insufficient documentation

## 2011-02-02 DIAGNOSIS — J984 Other disorders of lung: Secondary | ICD-10-CM | POA: Insufficient documentation

## 2011-02-02 DIAGNOSIS — K5710 Diverticulosis of small intestine without perforation or abscess without bleeding: Secondary | ICD-10-CM | POA: Insufficient documentation

## 2011-02-02 DIAGNOSIS — M5137 Other intervertebral disc degeneration, lumbosacral region: Secondary | ICD-10-CM | POA: Insufficient documentation

## 2011-02-02 DIAGNOSIS — M51379 Other intervertebral disc degeneration, lumbosacral region without mention of lumbar back pain or lower extremity pain: Secondary | ICD-10-CM | POA: Insufficient documentation

## 2011-02-02 DIAGNOSIS — C8590 Non-Hodgkin lymphoma, unspecified, unspecified site: Secondary | ICD-10-CM

## 2011-02-02 MED ORDER — IOHEXOL 300 MG/ML  SOLN
100.0000 mL | Freq: Once | INTRAMUSCULAR | Status: AC | PRN
  Administered 2011-02-02: 100 mL via INTRAVENOUS

## 2011-02-13 NOTE — Op Note (Signed)
[name], [name]                ACCOUNT NO.:  [id number]   MEDICAL RECORD NO.:  [id number]          PATIENT TYPE:  AMB   LOCATION:  DSC                          FACILITY:  [facility]   PHYSICIAN:  Currie Paris, M.D.DATE OF BIRTH:  01/13/1941   DATE OF PROCEDURE:  08/22/2007  DATE OF DISCHARGE:                               OPERATIVE REPORT   CCS#:  [id number].   PREOPERATIVE DIAGNOSIS:  Lymphoma.   POSTOPERATIVE DIAGNOSIS:  Lymphoma.   OPERATION:  Port-A-Cath placement.   SURGEON:  Currie Paris, M.D.   ANESTHESIA:  MAC.   CLINICAL HISTORY:  This is a 70 year old gentleman recently diagnosed  with a fairly large abdominal lymphoma. Port-A-Cath placement was  requested for chemo.   DESCRIPTION OF PROCEDURE:  The patient seen in the holding area and we  reviewed again the indications, risks and complications.  He had no  further questions.   The patient was taken to the operating room and after satisfactory IV  sedation, the upper chest and lower neck were clipped, prepped and  draped as a sterile field.  The time-out was performed.   I infiltrated some Xylocaine under the left infraclavicular fossa and  was able to enter the subclavian vein on the initial attempt.  I  threaded the guidewire by fluoro and it looked like it was well into the  right atrium ventricular area.   Additional local was infiltrated and a transverse pocket incision made  in a pocket fashion.  Port-A-Cath tubing was brought through a  subcutaneous tunnel.  With the patient continuing to be in Trendelenburg  position, the tract was dilated once with the 8 dilator peel-away  sheath.  The catheter threaded easily to approximately 25 cm.  The peel-  away sheath was removed.  The catheter aspirated and flushed easily and  appeared to be in the distal superior vena cava.   I then attached it to the reservoir which aspirated and flushed easily  and then was placed into the pocket.  At this point, I  could not get any  back flow back when I attempted to aspirate it again and using fluoro,  it appeared that the tip had backed up into the proximal superior vena  cava right at the junction with the innominate.   At this point, I disconnected it and brought the catheter tubing back up  into the subclavian site, threaded the guidewire through, advanced it,  tried to reposition it and got again into the right atrial area.  I  again attached it to the reservoir but again it seemed to backup just  with a little bit of positioning. This was accomplished a third time and  in between the two, I had the guidewire in place and fluoro'd the entire  length of the guidewire and showed that to the radiologist to be sure  that we were not in some abnormality but he thought that the anatomy  looked appropriate and I appeared to be in the junction of the right  atrium, right ventricle with the guidewire.   Using a new dilator peel-away  sheath, the catheter was reintroduced  after measuring it.  It appeared to be in the mid superior vena cava.  It was attached to the reservoir and because I was a little bit short on  the catheter, I had to place the reservoir a little bit more superiorly  in its pocket.  It aspirated and flushed easily.  A final check with  fluoro showed [place] to have good positioning, no kinks and the tip appeared  to be in the superior vena cava.   It was aspirated and then flushed a final time with dilute heparin  followed by 5 mL of concentrated heparin.  It was secured to the fascia  with 2-0 Prolene.  The skin was closed with subcutaneous Vicryl and 4-0  Monocryl subcuticular plus Dermabond.   The patient tolerated the procedure well and there were no  complications.  All counts were correct.      Currie Paris, M.D.  Electronically Signed     CJS/MEDQ  D:  08/22/2007  T:  08/23/2007  Job:  [id number]   cc:   Quita Skye. Artis Flock, M.D.  Samul Dada, M.D.

## 2011-05-03 ENCOUNTER — Encounter: Payer: Self-pay | Admitting: Internal Medicine

## 2011-07-10 LAB — CHROMOSOME ANALYSIS, BONE MARROW

## 2011-07-10 LAB — DIFFERENTIAL
Eosinophils Absolute: 0 — ABNORMAL LOW
Eosinophils Relative: 0
Lymphs Abs: 0.9
Monocytes Relative: 13 — ABNORMAL HIGH
Neutrophils Relative %: 78 — ABNORMAL HIGH

## 2011-07-10 LAB — BASIC METABOLIC PANEL
BUN: 16
CO2: 27
Calcium: 10.3
Creatinine, Ser: 1.62 — ABNORMAL HIGH
GFR calc Af Amer: 52 — ABNORMAL LOW
Glucose, Bld: 117 — ABNORMAL HIGH

## 2011-07-10 LAB — CBC
HCT: 41.4
Hemoglobin: 14.3
MCHC: 34.3
Platelets: 316
RDW: 13.1
RDW: 12.7

## 2011-07-30 ENCOUNTER — Encounter (HOSPITAL_BASED_OUTPATIENT_CLINIC_OR_DEPARTMENT_OTHER): Payer: BC Managed Care – PPO | Admitting: Oncology

## 2011-07-30 ENCOUNTER — Other Ambulatory Visit (HOSPITAL_COMMUNITY): Payer: Self-pay | Admitting: Oncology

## 2011-07-30 ENCOUNTER — Telehealth: Payer: Self-pay | Admitting: Internal Medicine

## 2011-07-30 DIAGNOSIS — C8583 Other specified types of non-Hodgkin lymphoma, intra-abdominal lymph nodes: Secondary | ICD-10-CM

## 2011-07-30 DIAGNOSIS — C8589 Other specified types of non-Hodgkin lymphoma, extranodal and solid organ sites: Secondary | ICD-10-CM

## 2011-07-30 DIAGNOSIS — Z23 Encounter for immunization: Secondary | ICD-10-CM

## 2011-07-30 LAB — CBC WITH DIFFERENTIAL/PLATELET
BASO%: 0.7 % (ref 0.0–2.0)
Basophils Absolute: 0 10*3/uL (ref 0.0–0.1)
Eosinophils Absolute: 0.2 10*3/uL (ref 0.0–0.5)
HCT: 43.5 % (ref 38.4–49.9)
HGB: 14.8 g/dL (ref 13.0–17.1)
LYMPH%: 28.4 % (ref 14.0–49.0)
MCHC: 34 g/dL (ref 32.0–36.0)
MONO#: 0.7 10*3/uL (ref 0.1–0.9)
NEUT%: 56 % (ref 39.0–75.0)
Platelets: 173 10*3/uL (ref 140–400)
WBC: 5.9 10*3/uL (ref 4.0–10.3)

## 2011-07-30 LAB — COMPREHENSIVE METABOLIC PANEL
BUN: 17 mg/dL (ref 6–23)
CO2: 23 mEq/L (ref 19–32)
Calcium: 9.3 mg/dL (ref 8.4–10.5)
Creatinine, Ser: 1.04 mg/dL (ref 0.50–1.35)
Glucose, Bld: 92 mg/dL (ref 70–99)
Total Bilirubin: 0.4 mg/dL (ref 0.3–1.2)

## 2011-07-30 LAB — LACTATE DEHYDROGENASE: LDH: 135 U/L (ref 94–250)

## 2011-07-30 NOTE — Telephone Encounter (Signed)
Pt given appt schedule for may 2013.

## 2012-01-31 ENCOUNTER — Other Ambulatory Visit (HOSPITAL_BASED_OUTPATIENT_CLINIC_OR_DEPARTMENT_OTHER): Payer: BC Managed Care – PPO | Admitting: Lab

## 2012-01-31 ENCOUNTER — Telehealth: Payer: Self-pay

## 2012-01-31 ENCOUNTER — Ambulatory Visit (HOSPITAL_BASED_OUTPATIENT_CLINIC_OR_DEPARTMENT_OTHER): Payer: BC Managed Care – PPO | Admitting: Oncology

## 2012-01-31 ENCOUNTER — Encounter: Payer: Self-pay | Admitting: Oncology

## 2012-01-31 ENCOUNTER — Ambulatory Visit (HOSPITAL_COMMUNITY)
Admission: RE | Admit: 2012-01-31 | Discharge: 2012-01-31 | Disposition: A | Discharge status: Home / Self Care | Payer: BC Managed Care – PPO | Source: Ambulatory Visit | Attending: Oncology | Admitting: Oncology

## 2012-01-31 ENCOUNTER — Telehealth: Payer: Self-pay | Admitting: Oncology

## 2012-01-31 VITALS — BP 119/77 | HR 63 | Temp 98.3°F | Ht 71.0 in | Wt 212.0 lb

## 2012-01-31 DIAGNOSIS — C859A Non-Hodgkin lymphoma, unspecified, in remission: Secondary | ICD-10-CM

## 2012-01-31 DIAGNOSIS — C8590 Non-Hodgkin lymphoma, unspecified, unspecified site: Secondary | ICD-10-CM

## 2012-01-31 DIAGNOSIS — C8583 Other specified types of non-Hodgkin lymphoma, intra-abdominal lymph nodes: Secondary | ICD-10-CM

## 2012-01-31 DIAGNOSIS — R0989 Other specified symptoms and signs involving the circulatory and respiratory systems: Secondary | ICD-10-CM

## 2012-01-31 DIAGNOSIS — C8589 Other specified types of non-Hodgkin lymphoma, extranodal and solid organ sites: Secondary | ICD-10-CM

## 2012-01-31 LAB — COMPREHENSIVE METABOLIC PANEL
ALT: 23 U/L (ref 0–53)
CO2: 28 mEq/L (ref 19–32)
Creatinine, Ser: 1.12 mg/dL (ref 0.50–1.35)
Total Bilirubin: 0.6 mg/dL (ref 0.3–1.2)

## 2012-01-31 LAB — CBC WITH DIFFERENTIAL/PLATELET
Basophils Absolute: 0.1 10*3/uL (ref 0.0–0.1)
HCT: 44 % (ref 38.4–49.9)
HGB: 15.2 g/dL (ref 13.0–17.1)
MONO#: 0.6 10*3/uL (ref 0.1–0.9)
NEUT#: 3.3 10*3/uL (ref 1.5–6.5)
NEUT%: 57.1 % (ref 39.0–75.0)
RDW: 13.4 % (ref 11.0–14.6)
WBC: 5.8 10*3/uL (ref 4.0–10.3)
lymph#: 1.6 10*3/uL (ref 0.9–3.3)
nRBC: 0 % (ref 0–0)

## 2012-01-31 LAB — LACTATE DEHYDROGENASE: LDH: 135 U/L (ref 94–250)

## 2012-01-31 NOTE — Progress Notes (Signed)
CC:   Dario Guardian, M.D. Currie Paris, M.D.  PROBLEM LIST: 1. Large cell B-cell non-Hodgkin's lymphoma, high grade with biopsy     carried out on 08/13/2007 from a large abdominal mass.  Bone marrow     on 08/26/2007 was negative.  The patient was felt to have stage II     disease.  CD 20 was positive.  The patient received 6 cycles of     Rituxan CHOD from 08/27/2007 through 11/05/2007 with the     achievement of a complete remission. 2. Bibasilar rales first noted in April 2010. 3. History of glaucoma dating back to 2002 under the care of Dr. Jethro Bolus. 4. 4 mm right middle lobe nodule noted on CT scan of August 05, 2007     felt to be benign and stable. 5. 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  MEDICATIONS: 1. AndroGel applied to skin daily. 2. Multivitamins. 3. Xalatan 0.005% ophthalmic solution.  HISTORY:  I saw [name] today for followup of his high grade large B-cell non-Hodgkin's lymphoma, CD 20 positive, with diagnosis going back to November 2008 when the patient was found to have a painful large mass in the mid upper left abdomen.  He was having difficulty eating at that time.  The patient is in a complete remission following 6 cycles of chemotherapy.  He was last seen by [place] on 07/30/2011.  He feels entirely well.  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  He is without any complaints today.  He was placed on AndroGel about a month ago for a low testosterone and low energy.  He feels somewhat improved.  PHYSICAL EXAM:  [name] looks well.  Weight is 212 pounds up from 207 pounds 6 months ago.  Height 5 feet 11 inches, body surface area 2.19 sq/m.  Blood pressure 119/77.  Other vital signs are normal. O2 saturation on room air at rest was 97%.  There is no scleral icterus. Mouth and pharynx are benign.  No peripheral adenopathy palpable.  No axillary or inguinal adenopathy.  Cardiac:  Regular  rhythm without murmur or rub.  Lungs:  Continue to reveal bibasilar inspiratory rales about halfway up on both sides, perhaps louder on the right than the left.  Patient's Port-A-Cath was removed on 06/23/2010.  Abdomen: Benign with no organomegaly or masses palpable.  The patient has a cafe au lait hyperpigmented area in the left abdomen.  Extremities:  No peripheral edema or clubbing.  Neurologic:  Normal.  LABORATORY DATA:  Today, white count 5.8, ANC 3.3, hemoglobin 15.2, hematocrit 44.0 platelets 154,000.  Chemistries including an LDH are pending.  Chemistries from 07/30/2011 were entirely normal.  IMAGING STUDIES: 1. PET scan from 08/12/2007 showed large infiltrative masses in the     mid to upper left abdomen markedly hypermetabolic most compatible     with lymphoma.  There was also an inguinal mass felt to be a     varicocele.  Small right middle lobe nodule demonstrated no     hypermetabolic activity and in the past has measured about 4 mm. 2. PET scan from 10/15/2007 showed near complete metabolic and     anatomic response to therapy.  Specifically the large left upper     quadrant mass has almost completely resolved.  There was some     residual perisplenic and peripancreatic soft tissue infiltration     and fat stranding  which exhibited mild low-level FDG uptake. 3. PET scan from 05/19/2008 showed no recurrent abnormal     hypermetabolic activity.  There was continued reduction in the     prominence of the treated lymphoma in the left retroperitoneum. 4. CT scan of abdomen and pelvis showed no findings in the abdomen to     suggest recurrent lymphoma.  There was an enlarged prostate gland     and a circumferentially thickened and under distended bladder. 5. CT scans of chest, abdomen, and pelvis with IV contrast on     02/02/2011 showed a stable 4 mm right middle lobe nodule.  There     were some findings to suggest central venous insufficiency likely     at the left  brachial cephalic vein.  There was no evidence for     active lymphoma in the abdomen or pelvis.  IMPRESSION AND PLAN:  [name] is now out 4-1/2 years from the time of diagnosis without evidence for recurrent lymphoma.  He remains asymptomatic.  His last set of scans were a year ago.  I do not think we need to repeat those.  Because of his rales, we will go ahead and get a chest x-ray today.  Will plan to see the patient again in 6 months at which time we will check CBC and chemistries including an LDH.    ______________________________ Samul Dada, M.D. DSM/MEDQ  D:  01/31/2012  T:  01/31/2012  Job:  [id number]

## 2012-01-31 NOTE — Telephone Encounter (Signed)
appts made and printed for pt and pt sent to xray   aom 

## 2012-01-31 NOTE — Progress Notes (Signed)
This office note has been dictated.  #[id number]

## 2012-01-31 NOTE — Telephone Encounter (Signed)
lvm CXR shows possibility of infection. DSM stated he was getting over a cold and did not feel he needed an antibiotic. Pt to call if worsening or any questions.

## 2012-02-04 NOTE — Telephone Encounter (Signed)
error 

## 2012-08-01 ENCOUNTER — Telehealth: Payer: Self-pay | Admitting: Oncology

## 2012-08-01 ENCOUNTER — Encounter: Payer: Self-pay | Admitting: Family

## 2012-08-01 ENCOUNTER — Other Ambulatory Visit (HOSPITAL_BASED_OUTPATIENT_CLINIC_OR_DEPARTMENT_OTHER): Payer: BC Managed Care – PPO

## 2012-08-01 ENCOUNTER — Ambulatory Visit (HOSPITAL_BASED_OUTPATIENT_CLINIC_OR_DEPARTMENT_OTHER): Payer: BC Managed Care – PPO | Admitting: Family

## 2012-08-01 VITALS — BP 136/71 | HR 67 | Temp 98.3°F | Resp 20 | Ht 71.0 in | Wt 210.6 lb

## 2012-08-01 DIAGNOSIS — C8590 Non-Hodgkin lymphoma, unspecified, unspecified site: Secondary | ICD-10-CM

## 2012-08-01 DIAGNOSIS — C8583 Other specified types of non-Hodgkin lymphoma, intra-abdominal lymph nodes: Secondary | ICD-10-CM

## 2012-08-01 DIAGNOSIS — R918 Other nonspecific abnormal finding of lung field: Secondary | ICD-10-CM

## 2012-08-01 LAB — COMPREHENSIVE METABOLIC PANEL (CC13)
BUN: 16 mg/dL (ref 7.0–26.0)
CO2: 24 mEq/L (ref 22–29)
Calcium: 9.4 mg/dL (ref 8.4–10.4)
Chloride: 107 mEq/L (ref 98–107)
Creatinine: 1 mg/dL (ref 0.7–1.3)
Glucose: 112 mg/dl — ABNORMAL HIGH (ref 70–99)

## 2012-08-01 LAB — CBC WITH DIFFERENTIAL/PLATELET
Basophils Absolute: 0.1 10*3/uL (ref 0.0–0.1)
HCT: 43.7 % (ref 38.4–49.9)
HGB: 15.1 g/dL (ref 13.0–17.1)
MCH: 31.9 pg (ref 27.2–33.4)
MONO#: 0.6 10*3/uL (ref 0.1–0.9)
NEUT%: 60.5 % (ref 39.0–75.0)
WBC: 6.5 10*3/uL (ref 4.0–10.3)
lymph#: 1.7 10*3/uL (ref 0.9–3.3)

## 2012-08-01 LAB — LACTATE DEHYDROGENASE (CC13): LDH: 162 U/L (ref 125–220)

## 2012-08-01 NOTE — Progress Notes (Signed)
Patient ID: [name], male   DOB: Jun 04, 1941, 71 y.o.   MRN: [id number] CSN: [id number]  CC: [name], M.D.  [name], M.D.  Problem List: [name] is a 71 y.o. Caucasian male with a problem list consisting of:  1. Large cell B-cell non-Hodgkin's lymphoma, high grade with biopsy carried out on 08/13/2007 from a large abdominal mass. Bone marrow on 08/26/2007 was negative. The patient was felt to have stage II disease. CD 20 was positive. The patient received 6 cycles of Rituxan CHOD from 08/27/2007 through 11/05/2007 with the achievement of a complete remission.  2. Bibasilar rales first noted in April 2010.  3. History of glaucoma dating back to 2002 under the care of Dr. Jethro Galloway.  4. 4 mm right middle lobe nodule noted on CT scan of August 05, 2007 felt to be benign and stable.  5. 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   Dr. Arline Galloway and I saw [name] today for followup of his high grade large B-cell non-Hodgkin's lymphoma, CD 20 positive, with diagnosis going back to November 2008 when the patient was found to have a painful large mass in the mid upper left abdomen. He was having difficulty eating at that time. The patient is in a complete remission following 6 cycles of chemotherapy. He was last seen by [place] on 01/31/2012. He feels entirely well. 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 He is without any complaints today. He was placed on AndroGel  for a low testosterone and low energy. He feels somewhat improved.  [name] denies any symptomatology today including pain, unusual bleeding, fever, chills, N/V/D or constipation.  Past Medical History: Past Medical History  Diagnosis Date  . Non Hodgkin's lymphoma   . Glaucoma   . Low testosterone     Surgical History: History reviewed. No pertinent past surgical history.  Current Medications: Current Outpatient Prescriptions  Medication Sig  Dispense Refill  . latanoprost (XALATAN) 0.005 % ophthalmic solution Place 1 drop into both eyes at bedtime.      . Multiple Vitamin (MULTIVITAMIN) capsule Take 1 capsule by mouth daily.      Marland Kitchen testosterone (ANDROGEL) 50 MG/5GM GEL Place 5 g onto the skin daily.        Allergies: Allergies  Allergen Reactions  . Ampicillin Rash    Family History: Family History  Problem Relation Age of Onset  . Diabetes Mother   . Cancer Brother   . Glaucoma Brother     Social History: History  Substance Use Topics  . Smoking status: Former Smoker    Types: Cigars  . Smokeless tobacco: Never Used  . Alcohol Use: Yes     A glass of wine occasionally    Review of Systems: 10 Point review of systems was completed and is negative.   Physical Exam:   Blood pressure 136/71, pulse 67, temperature 98.3 F (36.8 C), temperature source Oral, resp. rate 20, height 5\' 11"  (1.803 m), weight 210 lb 9.6 oz (95.528 kg).  General appearance: Alert, cooperative, well nourished, no apparent distress Head: Normocephalic, without obvious abnormality, atraumatic Eyes: Conjunctivae/corneas clear, PERRLA, EOMI Nose: Nares, septum and mucosa are normal, no drainage or sinus tenderness Neck: No adenopathy, supple, symmetrical, trachea midline, thyroid not enlarged, no tenderness Resp: Clear to auscultation bilaterally Cardio: Regular rate and rhythm, S1, S2 normal, no murmur, click, rub or gallop GI: Soft, non-tender, normoactive bowel sounds, no organomegaly  Extremities: Extremities normal, atraumatic, no cyanosis or edema Lymph nodes: Cervical, supraclavicular, and axillary nodes normal Neurologic: Grossly normal   Laboratory Data: Results for orders placed in visit on 08/01/12 (from the past 48 hour(s))  CBC WITH DIFFERENTIAL     Status: Normal   Collection Time   08/01/12  3:08 PM      Component Value Range Comment   WBC 6.5  4.0 - 10.3 10e3/uL    NEUT# 3.9  1.5 - 6.5 10e3/uL    HGB 15.1  13.0 -  17.1 g/dL    HCT 78.2  95.6 - 21.3 %    Platelets 178  140 - 400 10e3/uL    MCV 92.3  79.3 - 98.0 fL    MCH 31.9  27.2 - 33.4 pg    MCHC 34.5  32.0 - 36.0 g/dL    RBC 0.86  5.78 - 4.69 10e6/uL    RDW 13.0  11.0 - 14.6 %    lymph# 1.7  0.9 - 3.3 10e3/uL    MONO# 0.6  0.1 - 0.9 10e3/uL    Eosinophils Absolute 0.2  0.0 - 0.5 10e3/uL    Basophils Absolute 0.1  0.0 - 0.1 10e3/uL    NEUT% 60.5  39.0 - 75.0 %    LYMPH% 25.9  14.0 - 49.0 %    MONO% 9.8  0.0 - 14.0 %    EOS% 2.7  0.0 - 7.0 %    BASO% 1.1  0.0 - 2.0 %      Imaging Studies: 1. PET scan from 08/12/2007 showed large infiltrative masses in the mid to upper left abdomen markedly hypermetabolic most compatible with lymphoma. There was also an inguinal mass felt to be a varicocele. Small right middle lobe nodule demonstrated no hypermetabolic activity and in the past has measured about 4 mm.  2. PET scan from 10/15/2007 showed near complete metabolic and anatomic response to therapy. Specifically the large left upper quadrant mass has almost completely resolved. There was some residual perisplenic and peripancreatic soft tissue infiltration and fat stranding which exhibited mild low-level FDG uptake.  3. PET scan from 05/19/2008 showed no recurrent abnormal hypermetabolic activity. There was continued reduction in the  prominence of the treated lymphoma in the left retroperitoneum.  4. CT scan of abdomen and pelvis showed no findings in the abdomen to suggest recurrent lymphoma. There was an enlarged prostate gland and a circumferentially thickened and under distended bladder.  5. CT scans of chest, abdomen, and pelvis with IV contrast on 02/02/2011 showed a stable 4 mm right middle lobe nodule. There  were some findings to suggest central venous insufficiency likely at the left brachial cephalic vein. There was no evidence for  active lymphoma in the abdomen or pelvis. 5. Chest x-ray 2 view on 01/30/2030 which showed mild patchy opacity  in the right lower lobe, possibly infectious. No pleural effusion or pneumothorax. Cardiomediastinal silhouette is within normal limits. Mild degenerative changes of the visualized thoracolumbar spine.   Impression/Plan: Timothy Sena is now almost 5 years from the time of diagnosis without evidence for recurrent lymphoma. He remains asymptomatic. His last set of scans were a year ago.  Will plan to see the patient again in 6 months at which time we will check CT scans of the abdomen/pelvis and chest with contrast,  CBC and chemistries including an LDH.  Timothy Gudmundson is asked to contact us in the interim if he has any questions or concerns.    Tedra Coppernoll,  NP-C 08/01/2012, 3:02 PM

## 2012-08-01 NOTE — Telephone Encounter (Signed)
Printed and gv pt appt schedule to pt for April and May

## 2012-08-01 NOTE — Patient Instructions (Addendum)
Please contact for any questions or concerns.

## 2013-01-26 ENCOUNTER — Telehealth: Payer: Self-pay | Admitting: Oncology

## 2013-01-26 NOTE — Telephone Encounter (Signed)
appt for 5/2 moved to 5/14 per DM-desk nurse due to PM PAL. lmonvm informing pt and confirmed 4/30 lb/ct. Added not to 4/30 lb appt to send pt for new schedule.

## 2013-01-28 ENCOUNTER — Other Ambulatory Visit (HOSPITAL_BASED_OUTPATIENT_CLINIC_OR_DEPARTMENT_OTHER): Payer: BC Managed Care – PPO | Admitting: Lab

## 2013-01-28 ENCOUNTER — Ambulatory Visit (HOSPITAL_COMMUNITY)
Admission: RE | Admit: 2013-01-28 | Discharge: 2013-01-28 | Disposition: A | Discharge status: Home / Self Care | Payer: BC Managed Care – PPO | Source: Ambulatory Visit | Attending: Family | Admitting: Family

## 2013-01-28 ENCOUNTER — Encounter (HOSPITAL_COMMUNITY): Payer: Self-pay

## 2013-01-28 DIAGNOSIS — C8590 Non-Hodgkin lymphoma, unspecified, unspecified site: Secondary | ICD-10-CM

## 2013-01-28 DIAGNOSIS — C8589 Other specified types of non-Hodgkin lymphoma, extranodal and solid organ sites: Secondary | ICD-10-CM | POA: Insufficient documentation

## 2013-01-28 DIAGNOSIS — K429 Umbilical hernia without obstruction or gangrene: Secondary | ICD-10-CM | POA: Insufficient documentation

## 2013-01-28 LAB — CBC WITH DIFFERENTIAL/PLATELET
Basophils Absolute: 0 10*3/uL (ref 0.0–0.1)
EOS%: 4.8 % (ref 0.0–7.0)
Eosinophils Absolute: 0.3 10*3/uL (ref 0.0–0.5)
HGB: 15.5 g/dL (ref 13.0–17.1)
NEUT#: 3.2 10*3/uL (ref 1.5–6.5)
RDW: 12.9 % (ref 11.0–14.6)
lymph#: 1.6 10*3/uL (ref 0.9–3.3)

## 2013-01-28 LAB — COMPREHENSIVE METABOLIC PANEL (CC13)
ALT: 19 U/L (ref 0–55)
Albumin: 3.8 g/dL (ref 3.5–5.0)
Alkaline Phosphatase: 68 U/L (ref 40–150)
CO2: 23 mEq/L (ref 22–29)
Glucose: 109 mg/dl — ABNORMAL HIGH (ref 70–99)
Potassium: 4.3 mEq/L (ref 3.5–5.1)
Sodium: 139 mEq/L (ref 136–145)
Total Protein: 6.7 g/dL (ref 6.4–8.3)

## 2013-01-28 MED ORDER — IOHEXOL 300 MG/ML  SOLN
100.0000 mL | Freq: Once | INTRAMUSCULAR | Status: AC | PRN
Start: 2013-01-28 — End: 2013-01-28
  Administered 2013-01-28: 100 mL via INTRAVENOUS

## 2013-01-30 ENCOUNTER — Ambulatory Visit: Payer: BC Managed Care – PPO | Admitting: Oncology

## 2013-01-30 ENCOUNTER — Other Ambulatory Visit: Payer: BC Managed Care – PPO | Admitting: Lab

## 2013-02-11 ENCOUNTER — Telehealth: Payer: Self-pay | Admitting: Oncology

## 2013-02-11 ENCOUNTER — Ambulatory Visit (HOSPITAL_BASED_OUTPATIENT_CLINIC_OR_DEPARTMENT_OTHER): Payer: BC Managed Care – PPO | Admitting: Oncology

## 2013-02-11 ENCOUNTER — Encounter: Payer: Self-pay | Admitting: Oncology

## 2013-02-11 VITALS — BP 116/68 | HR 68 | Temp 98.1°F | Resp 18 | Ht 71.0 in | Wt 204.5 lb

## 2013-02-11 DIAGNOSIS — C859A Non-Hodgkin lymphoma, unspecified, in remission: Secondary | ICD-10-CM

## 2013-02-11 DIAGNOSIS — C8583 Other specified types of non-Hodgkin lymphoma, intra-abdominal lymph nodes: Secondary | ICD-10-CM

## 2013-02-11 DIAGNOSIS — C8590 Non-Hodgkin lymphoma, unspecified, unspecified site: Secondary | ICD-10-CM

## 2013-02-11 NOTE — Progress Notes (Signed)
CC:   Dario Guardian, M.D. Currie Paris, M.D.  PROBLEM LIST:  1. Large cell B-cell non-Hodgkin's lymphoma, high grade with biopsy  carried out on 08/13/2007 from a large abdominal mass. Bone marrow  on 08/26/2007 was negative. The patient was felt to have stage II  disease. CD 20 was positive. The patient received 6 cycles of  Rituxan CHOD from 08/27/2007 through 11/05/2007 with the  achievement of a complete remission.  2. Bibasilar rales first noted in April 2010.  3. History of glaucoma dating back to 2002 under the care of Dr. Jethro Bolus.  4. 4 mm right middle lobe nodule noted on CT scan of August 05, 2007  felt to be benign and stable.  5. 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   MEDICATIONS:  Reviewed and recorded. Current Outpatient Prescriptions  Medication Sig Dispense Refill  . latanoprost (XALATAN) 0.005 % ophthalmic solution Place 1 drop into both eyes at bedtime.      . Multiple Vitamin (MULTIVITAMIN) capsule Take 1 capsule by mouth daily.      Marland Kitchen testosterone (ANDROGEL) 50 MG/5GM GEL Place 5 g onto the skin daily.       No current facility-administered medications for this visit.    SMOKING HISTORY:  The patient is a former cigarette smoker.  He has never used smokeless tobacco.   HISTORY:  [name] was seen today for followup of his high-grade large B cell non-Hodgkin lymphoma, CD20 positive, with diagnosis going back to November 2008 when the patient was found to have a painful large mass in the mid upper left abdomen.  He was having difficulty eating at that time.  The patient remains in complete remission following 6 cycles of chemotherapy of Rituxan/CHOD which he received from 08/27/2007 through 11/05/2007.  The patient was last seen by [place] on 08/01/2012 and prior to that on 01/31/2012.  He had CT scans of chest, abdomen, and pelvis carried out on 01/28/2013.  No evidence of recurrent lymphoma was noted.  The patient is without  complaints today.  He feels well with no symptoms to suggest recurrence.  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  He will be returning to [facility] during the first week of August.  PHYSICAL EXAMINATION:  General:  The patient looks well.  Weight is 204 pounds 8 ounces, height 5 feet 11 inches, body surface area 2.16 sq m. Vital Signs:  Blood pressure 116/68.  Other vital signs are normal. HEENT:  There is no scleral icterus.  Mouth and pharynx are benign. Lymph nodes:  There is no peripheral adenopathy palpable in the neck, supraclavicular, axillary, or inguinal areas.  Heart and lungs:  Normal. Abdomen:  Benign with no organomegaly or masses palpable.  In the past we had noted a cafe au lait hyperpigmented area on the left abdomen.  A small umbilical hernia was present.  Extremities:  No peripheral edema or clubbing.  Neurologic:  Exam was normal.  LABORATORY DATA:  Today, white count 5.6, ANC 3.2, hemoglobin 15.5, hematocrit 43.3, platelets 151,000.  Chemistries today were normal, although the glucose was slightly elevated at 109.  IMAGING STUDIES:  1. PET scan from 08/12/2007 showed large infiltrative masses in the  mid to upper left abdomen markedly hypermetabolic most compatible  with lymphoma. There was also an inguinal mass felt to be a  varicocele. Small right middle lobe nodule demonstrated no  hypermetabolic activity and in the past  has measured about 4 mm.  2. PET scan from 10/15/2007 showed near complete metabolic and  anatomic response to therapy. Specifically the large left upper  quadrant mass has almost completely resolved. There was some  residual perisplenic and peripancreatic soft tissue infiltration  and fat stranding which exhibited mild low-level FDG uptake.  3. PET scan from 05/19/2008 showed no recurrent abnormal  hypermetabolic activity. There was continued reduction in the  prominence of the treated  lymphoma in the left retroperitoneum.  4. CT scan of abdomen and pelvis showed no findings in the abdomen to  suggest recurrent lymphoma. There was an enlarged prostate gland  and a circumferentially thickened and under distended bladder.  5. CT scans of chest, abdomen, and pelvis with IV contrast on  02/02/2011 showed a stable 4 mm right middle lobe nodule. There  were some findings to suggest central venous insufficiency likely  at the left brachial cephalic vein. There was no evidence for  active lymphoma in the abdomen or pelvis. 6. Chest x-ray, 2 view, from 01/31/2012 showed mild patchy opacity in     the right lower lobe, as well as some mild degenerative changes in     the thoracolumbar spine. 7. CT scan of chest, abdomen, and pelvis with IV contrast on     01/28/2013 showed a stable 4 mm right middle lobe nodule, otherwise     negative for any evidence of lymphoma recurrence.  A small     umbilical hernia was present.  The prostate gland was mildly     enlarged.  IMPRESSION AND PLAN:  [name] is now out 5-1/2 years from the time of diagnosis without evidence for recurrent lymphoma.  He remains asymptomatic.  I do not think we need to repeat any further CT scans. The patient tells me that when he was a [employer] he had mononucleosis and was quite sick for at least a week.  We will plan to have the patient return in 6 months, which will be mid November, at which time we will check CBC and chemistries.  Patient will be out 6 years at that time and may not require any further appointments at the [facility] if he is doing well.  The patient tells me that he has not had a colonoscopy in many years. He told me that he would arrange for a colonoscopy when he returns to [place] during the late summer.    ______________________________ Samul Dada, M.D. DSM/MEDQ  D:  02/11/2013  T:  02/11/2013  Job:  [id number]

## 2013-02-11 NOTE — Progress Notes (Signed)
This office note has been dictated.  #[id number]

## 2013-08-13 ENCOUNTER — Telehealth: Payer: Self-pay | Admitting: Internal Medicine

## 2013-08-13 ENCOUNTER — Other Ambulatory Visit: Payer: Self-pay | Admitting: Medical Oncology

## 2013-08-13 NOTE — Telephone Encounter (Signed)
, °

## 2013-08-14 ENCOUNTER — Ambulatory Visit: Payer: BC Managed Care – PPO

## 2013-08-14 ENCOUNTER — Other Ambulatory Visit: Payer: BC Managed Care – PPO | Admitting: Lab

## 2013-08-20 ENCOUNTER — Other Ambulatory Visit (HOSPITAL_BASED_OUTPATIENT_CLINIC_OR_DEPARTMENT_OTHER): Payer: BC Managed Care – PPO | Admitting: Lab

## 2013-08-20 ENCOUNTER — Encounter: Payer: Self-pay | Admitting: Internal Medicine

## 2013-08-20 ENCOUNTER — Encounter (INDEPENDENT_AMBULATORY_CARE_PROVIDER_SITE_OTHER): Payer: Self-pay

## 2013-08-20 ENCOUNTER — Telehealth: Payer: Self-pay | Admitting: Internal Medicine

## 2013-08-20 ENCOUNTER — Other Ambulatory Visit: Payer: Self-pay | Admitting: Internal Medicine

## 2013-08-20 ENCOUNTER — Ambulatory Visit (HOSPITAL_BASED_OUTPATIENT_CLINIC_OR_DEPARTMENT_OTHER): Payer: BC Managed Care – PPO | Admitting: Internal Medicine

## 2013-08-20 VITALS — BP 125/73 | HR 65 | Temp 98.2°F | Resp 20 | Ht 71.0 in | Wt 206.9 lb

## 2013-08-20 DIAGNOSIS — C8583 Other specified types of non-Hodgkin lymphoma, intra-abdominal lymph nodes: Secondary | ICD-10-CM

## 2013-08-20 DIAGNOSIS — C8590 Non-Hodgkin lymphoma, unspecified, unspecified site: Secondary | ICD-10-CM

## 2013-08-20 DIAGNOSIS — C859A Non-Hodgkin lymphoma, unspecified, in remission: Secondary | ICD-10-CM

## 2013-08-20 LAB — COMPREHENSIVE METABOLIC PANEL (CC13)
AST: 26 U/L (ref 5–34)
Alkaline Phosphatase: 66 U/L (ref 40–150)
BUN: 12.8 mg/dL (ref 7.0–26.0)
Creatinine: 1 mg/dL (ref 0.7–1.3)
Glucose: 96 mg/dl (ref 70–140)
Potassium: 4.4 mEq/L (ref 3.5–5.1)
Total Bilirubin: 0.58 mg/dL (ref 0.20–1.20)

## 2013-08-20 LAB — CBC WITH DIFFERENTIAL/PLATELET
Basophils Absolute: 0.1 10*3/uL (ref 0.0–0.1)
Eosinophils Absolute: 0.1 10*3/uL (ref 0.0–0.5)
HCT: 42.8 % (ref 38.4–49.9)
HGB: 14.5 g/dL (ref 13.0–17.1)
LYMPH%: 28.1 % (ref 14.0–49.0)
MCV: 91.8 fL (ref 79.3–98.0)
MONO#: 0.8 10*3/uL (ref 0.1–0.9)
MONO%: 12.8 % (ref 0.0–14.0)
NEUT#: 3.4 10*3/uL (ref 1.5–6.5)
Platelets: 166 10*3/uL (ref 140–400)
WBC: 6.1 10*3/uL (ref 4.0–10.3)

## 2013-08-20 NOTE — Telephone Encounter (Signed)
gv and printed appt sched and avs for pt for NOV 2015 °

## 2013-08-23 NOTE — Progress Notes (Signed)
[facility] OFFICE PROGRESS NOTE  Allean Found, MD [address], [address] [place] [address]  DIAGNOSIS: Lymphoma in remission - Plan: CBC with Differential, Comprehensive metabolic panel, Lactate dehydrogenase  Chief Complaint  Patient presents with  . Lymphoma in remission    CURRENT THERAPY:Observation.  INTERVAL HISTORY: [name] 72 y.o. male with a history of high-grade  large B cell non-Hodgkin lymphoma, CD20 positive, with diagnosis going back to November 2008 when the patient was found to have a painful large mass in the mid upper left abdomen is here for follow up.  He was last seen by Dr. Arline Asp on 02/11/2013. The patient remains in complete remission following 6 cycles of chemotherapy of Rituxan/CHOD which he received from 08/27/2007  through 11/05/2007. He had CT scans of chest, abdomen, and pelvis carried out on 01/28/2013. No evidence of recurrent lymphoma was noted. The patient is without complaints today. He denies fevers or chills or night sweats or weight changes.    MEDICAL HISTORY: Past Medical History  Diagnosis Date  . Non Hodgkin's lymphoma   . Glaucoma   . Low testosterone     INTERIM HISTORY: has Lymphoma in remission on his problem list.    ALLERGIES:  is allergic to ampicillin.  MEDICATIONS: has a current medication list which includes the following prescription(s): latanoprost, multivitamin, and testosterone.  SURGICAL HISTORY: No past surgical history on file.  REVIEW OF SYSTEMS:   Constitutional: Denies fevers, chills or abnormal weight loss Eyes: Denies blurriness of vision Ears, nose, mouth, throat, and face: Denies mucositis or sore throat Respiratory: Denies cough, dyspnea or wheezes Cardiovascular: Denies palpitation, chest discomfort or lower extremity swelling Gastrointestinal:  Denies nausea, heartburn or change in bowel habits Skin: Denies abnormal skin rashes Lymphatics: Denies new  lymphadenopathy or easy bruising Neurological:Denies numbness, tingling or new weaknesses Behavioral/Psych: Mood is stable, no new changes  All other systems were reviewed with the patient and are negative.  PROBLEM LIST:  1. Large cell B-cell non-Hodgkin's lymphoma, high grade with biopsy carried out on 08/13/2007 from a large abdominal mass. Bone marrow on 08/26/2007 was negative. The patient was felt to have stage II  disease. CD 20 was positive. The patient received 6 cycles of Rituxan CHOD from 08/27/2007 through 11/05/2007 with the achievement of a complete remission.  2. Bibasilar rales first noted in April 2010.  3. History of glaucoma dating back to 2002 under the care of Dr. Jethro Bolus.  4. 4 mm right middle lobe nodule noted on CT scan of August 05, 2007 felt to be benign and stable.  5. 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  PHYSICAL EXAMINATION: ECOG PERFORMANCE STATUS: 0 - Asymptomatic  Blood pressure 125/73, pulse 65, temperature 98.2 F (36.8 C), temperature source Oral, resp. rate 20, height 5\' 11"  (1.803 m), weight 206 lb 14.4 oz (93.849 kg).  GENERAL:alert, no distress and comfortable SKIN: skin color, texture, turgor are normal, no rashes or significant lesions EYES: normal, Conjunctiva are pink and non-injected, sclera clear OROPHARYNX:no exudate, no erythema and lips, buccal mucosa, and tongue normal  NECK: supple, thyroid normal size, non-tender, without nodularity LYMPH:  no palpable lymphadenopathy in the cervical, axillary or supraclavicular LUNGS: clear to auscultation and percussion with normal breathing effort HEART: regular rate & rhythm and no murmurs and no lower extremity edema ABDOMEN:abdomen soft, non-tender and normal bowel sounds Musculoskeletal:no cyanosis of digits and no clubbing  NEURO: alert & oriented x 3 with fluent speech, no focal  motor/sensory deficits  Labs:  Lab Results  Component Value Date   WBC 6.1 08/20/2013   HGB 14.5  08/20/2013   HCT 42.8 08/20/2013   MCV 91.8 08/20/2013   PLT 166 08/20/2013   NEUTROABS 3.4 08/20/2013      Chemistry      Component Value Date/Time   NA 139 08/20/2013 1552   NA 140 01/31/2012 1147   K 4.4 08/20/2013 1552   K 4.5 01/31/2012 1147   CL 106 01/28/2013 0916   CL 106 01/31/2012 1147   CO2 23 08/20/2013 1552   CO2 28 01/31/2012 1147   BUN 12.8 08/20/2013 1552   BUN 15 01/31/2012 1147   CREATININE 1.0 08/20/2013 1552   CREATININE 1.12 01/31/2012 1147      Component Value Date/Time   CALCIUM 9.4 08/20/2013 1552   CALCIUM 9.2 01/31/2012 1147   ALKPHOS 66 08/20/2013 1552   ALKPHOS 53 01/31/2012 1147   AST 26 08/20/2013 1552   AST 24 01/31/2012 1147   ALT 25 08/20/2013 1552   ALT 23 01/31/2012 1147   BILITOT 0.58 08/20/2013 1552   BILITOT 0.6 01/31/2012 1147     Basic Metabolic Panel:  Recent Labs Lab 08/20/13 1552  NA 139  K 4.4  CO2 23  GLUCOSE 96  BUN 12.8  CREATININE 1.0  CALCIUM 9.4   GFR Estimated Creatinine Clearance: 75.8 ml/min (by C-G formula based on Cr of 1). Liver Function Tests:  Recent Labs Lab 08/20/13 1552  AST 26  ALT 25  ALKPHOS 66  BILITOT 0.58  PROT 6.6  ALBUMIN 4.0   CBC:  Recent Labs Lab 08/20/13 1552  WBC 6.1  NEUTROABS 3.4  HGB 14.5  HCT 42.8  MCV 91.8  PLT 166   RADIOGRAPHIC STUDIES: 1. PET scan from 08/12/2007 showed large infiltrative masses in the mid to upper left abdomen markedly hypermetabolic most compatible with lymphoma. There was also an inguinal mass felt to be a varicocele. Small right middle lobe nodule demonstrated no hypermetabolic activity and in the past has measured about 4 mm.  2. PET scan from 10/15/2007 showed near complete metabolic and anatomic response to therapy. Specifically the large left upper quadrant mass has almost completely resolved. There was some residual perisplenic and peripancreatic soft tissue infiltration and fat stranding which exhibited mild low-level FDG uptake.  3. PET scan from  05/19/2008 showed no recurrent abnormal hypermetabolic activity. There was continued reduction in the prominence of the treated lymphoma in the left retroperitoneum.  4. CT scan of abdomen and pelvis showed no findings in the abdomen to suggest recurrent lymphoma. There was an enlarged prostate gland and a circumferentially thickened and under distended bladder.  5. CT scans of chest, abdomen, and pelvis with IV contrast on 02/02/2011 showed a stable 4 mm right middle lobe nodule. There were some findings to suggest central venous insufficiency likely at the left brachial cephalic vein. There was no evidence for active lymphoma in the abdomen or pelvis.  6. Chest x-ray, 2 view, from 01/31/2012 showed mild patchy opacity in the right lower lobe, as well as some mild degenerative changes in the thoracolumbar spine.  7. CT scan of chest, abdomen, and pelvis with IV contrast on 01/28/2013 showed a stable 4 mm right middle lobe nodule, otherwise negative for any evidence of lymphoma recurrence. A small umbilical hernia was present. The prostate gland was mildly enlarged.   ASSESSMENT: [name] 72 y.o. male with a history of Lymphoma in remission - Plan:  CBC with Differential, Comprehensive metabolic panel, Lactate dehydrogenase  PLAN:  1. Stage II Large B-cell Non-hodgins Lymphoma. 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 He remains asymptomatic.   --We will plan to have the patient return in one year at which time we will check CBC and chemistries. We discussed that he could follow-up with his PCP for continued observation but he request to continue annual visits with cancer center for now.  He has not scheduled the colonoscopy and he stated he would.    All questions were answered. The patient knows to call the clinic with any problems, questions or concerns. We can certainly see the patient much sooner if necessary.  I spent 10  minutes counseling the patient face to face. The total time spent in the appointment was 15 minutes.    Raynald Rouillard, MD 08/23/2013 12:41 AM

## 2014-06-30 ENCOUNTER — Telehealth: Payer: Self-pay | Admitting: Hematology

## 2014-06-30 NOTE — Telephone Encounter (Signed)
Lvm adivising appt time chg on 11/19 from 3pm to 11:30am due to md on call. Also mailed revised appt calendar.

## 2014-07-06 ENCOUNTER — Telehealth: Payer: Self-pay | Admitting: Hematology

## 2014-07-06 NOTE — Telephone Encounter (Signed)
pt called to r/s 11/19 to 11/11 - pt has new d/t

## 2014-08-11 ENCOUNTER — Ambulatory Visit (HOSPITAL_BASED_OUTPATIENT_CLINIC_OR_DEPARTMENT_OTHER): Payer: BC Managed Care – PPO | Admitting: Hematology

## 2014-08-11 ENCOUNTER — Other Ambulatory Visit (HOSPITAL_BASED_OUTPATIENT_CLINIC_OR_DEPARTMENT_OTHER): Payer: BC Managed Care – PPO

## 2014-08-11 ENCOUNTER — Encounter: Payer: Self-pay | Admitting: Hematology

## 2014-08-11 ENCOUNTER — Ambulatory Visit: Payer: BC Managed Care – PPO

## 2014-08-11 VITALS — BP 134/77 | HR 65 | Temp 99.0°F | Resp 18 | Ht 71.0 in | Wt 202.8 lb

## 2014-08-11 DIAGNOSIS — C8583 Other specified types of non-Hodgkin lymphoma, intra-abdominal lymph nodes: Secondary | ICD-10-CM

## 2014-08-11 DIAGNOSIS — C8590 Non-Hodgkin lymphoma, unspecified, unspecified site: Secondary | ICD-10-CM

## 2014-08-11 DIAGNOSIS — C859A Non-Hodgkin lymphoma, unspecified, in remission: Secondary | ICD-10-CM

## 2014-08-11 LAB — CBC WITH DIFFERENTIAL/PLATELET
BASO%: 1.3 % (ref 0.0–2.0)
Basophils Absolute: 0.1 10*3/uL (ref 0.0–0.1)
EOS ABS: 0.3 10*3/uL (ref 0.0–0.5)
EOS%: 5.3 % (ref 0.0–7.0)
HCT: 44.7 % (ref 38.4–49.9)
HEMOGLOBIN: 14.8 g/dL (ref 13.0–17.1)
LYMPH#: 1.7 10*3/uL (ref 0.9–3.3)
LYMPH%: 30.5 % (ref 14.0–49.0)
MCH: 30.8 pg (ref 27.2–33.4)
MCHC: 33.2 g/dL (ref 32.0–36.0)
MCV: 92.8 fL (ref 79.3–98.0)
MONO#: 0.6 10*3/uL (ref 0.1–0.9)
MONO%: 10.7 % (ref 0.0–14.0)
NEUT%: 52.2 % (ref 39.0–75.0)
NEUTROS ABS: 2.9 10*3/uL (ref 1.5–6.5)
Platelets: 172 10*3/uL (ref 140–400)
RBC: 4.82 10*6/uL (ref 4.20–5.82)
RDW: 13.3 % (ref 11.0–14.6)
WBC: 5.5 10*3/uL (ref 4.0–10.3)

## 2014-08-11 LAB — COMPREHENSIVE METABOLIC PANEL (CC13)
ALBUMIN: 4 g/dL (ref 3.5–5.0)
ALT: 20 U/L (ref 0–55)
AST: 21 U/L (ref 5–34)
Alkaline Phosphatase: 64 U/L (ref 40–150)
Anion Gap: 7 mEq/L (ref 3–11)
BUN: 16 mg/dL (ref 7.0–26.0)
CALCIUM: 9.4 mg/dL (ref 8.4–10.4)
CHLORIDE: 109 meq/L (ref 98–109)
CO2: 23 mEq/L (ref 22–29)
Creatinine: 1.1 mg/dL (ref 0.7–1.3)
GLUCOSE: 101 mg/dL (ref 70–140)
POTASSIUM: 4.7 meq/L (ref 3.5–5.1)
Sodium: 140 mEq/L (ref 136–145)
Total Bilirubin: 0.53 mg/dL (ref 0.20–1.20)
Total Protein: 6.3 g/dL — ABNORMAL LOW (ref 6.4–8.3)

## 2014-08-11 LAB — LACTATE DEHYDROGENASE (CC13): LDH: 138 U/L (ref 125–245)

## 2014-08-11 NOTE — Assessment & Plan Note (Signed)
[name] is now out 7 years from the time of diagnosis without evidence for recurrent lymphoma.  He is clinical doing well without any symptoms. Physical exam and lab are unremarkable. I do not think we need to repeat any further CT scans. He certainly can follow up with his primary care physician but he opts to do yearly follow-up with [place]. I'll see him in a year with lab CBC CMP and LDH.

## 2014-08-11 NOTE — Progress Notes (Signed)
[facility] OFFICE PROGRESS NOTE  Patient Care Team: Candace Wyline Copas, MD as PCP - General (Family Medicine)  SUMMARY OF ONCOLOGIC HISTORY:   Lymphoma in remission   08/13/2007 Cancer Diagnosis Large cell B-cell non-Hodgkin's lymphoma, high grade, CD20(+), diagnosed from a large abdominal mass biopsy. Bone marrow on 08/26/2007 was negative. The patient was felt to have stage II disease.    11/05/2007 -  Chemotherapy 6 cycles of Rituxan CHOD from 08/27/2007 through 11/05/2007 with the achievement of a complete remission.   01/31/2012 Initial Diagnosis Lymphoma in remission   Past Medical History  Diagnosis Date  . Non Hodgkin's lymphoma   . Glaucoma   . Low testosterone      INTERVAL HISTORY: [name] returns for follow-up. He has been doing well since his last visit 1 year ago. He denies any fever and night sweats. He has intentionally losing weight by watching his diet and he lost about 8 pounds in the past year. He probably strained his left shoulder by carrying a heavy bag during his last trip to have weeks ago and has been having mild left shoulder discomfort since then. No limitation of his shoulder movement. He otherwise denies any new pain cough shortness breath, or abdominal discomfort. 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    REVIEW OF SYSTEMS:   Constitutional: Denies fevers, chills. (+) 8lbs intentional weight loss in past year Eyes: Denies blurriness of vision Ears, nose, mouth, throat, and face: Denies mucositis or sore throat Respiratory: Denies cough, dyspnea or wheezes Cardiovascular: Denies palpitation, chest discomfort or lower extremity swelling Gastrointestinal:  Denies nausea, heartburn or change in bowel habits Skin: Denies abnormal skin rashes Lymphatics: Denies new lymphadenopathy or easy bruising Neurological:Denies numbness, tingling or new weaknesses Behavioral/Psych: Mood is stable, no new changes  All other  systems were reviewed with the patient and are negative.  I have reviewed the past medical history, past surgical history, social history and family history with the patient and they are unchanged from previous note.  ALLERGIES:  is allergic to ampicillin.  MEDICATIONS:  Current Outpatient Prescriptions  Medication Sig Dispense Refill  . cholecalciferol (VITAMIN D) 1000 UNITS tablet Take 4,000 Units by mouth daily.    Marland Kitchen latanoprost (XALATAN) 0.005 % ophthalmic solution Place 1 drop into both eyes at bedtime.    . Multiple Vitamin (MULTIVITAMIN) capsule Take 1 capsule by mouth daily.    . vitamin C (ASCORBIC ACID) 250 MG tablet Take 250 mg by mouth daily.    . Zinc Sulfate 220 (50 ZN) MG TABS Take 1 tablet by mouth daily.     No current facility-administered medications for this visit.    PHYSICAL EXAMINATION: ECOG PERFORMANCE STATUS: 0 - Asymptomatic  Filed Vitals:   08/11/14 1311  BP: 134/77  Pulse: 65  Temp: 99 F (37.2 C)  Resp: 18   Filed Weights   08/11/14 1311  Weight: 202 lb 12.8 oz (91.989 kg)    GENERAL:alert, no distress and comfortable SKIN: skin color, texture, turgor are normal, no rashes or significant lesions EYES: normal, Conjunctiva are pink and non-injected, sclera clear OROPHARYNX:no exudate, no erythema and lips, buccal mucosa, and tongue normal  NECK: supple, thyroid normal size, non-tender, without nodularity LYMPH:  no palpable lymphadenopathy in the cervical, axillary or inguinal LUNGS: clear to auscultation and percussion with normal breathing effort HEART: regular rate & rhythm and no murmurs and no lower extremity edema ABDOMEN:abdomen soft, non-tender and normal bowel sounds Musculoskeletal:no cyanosis  of digits and no clubbing  NEURO: alert & oriented x 3 with fluent speech, no focal motor/sensory deficits  LABORATORY DATA:  I have reviewed the data as listed   Lab Results  Component Value Date   WBC 5.5 08/11/2014   NEUTROABS 2.9  08/11/2014   HGB 14.8 08/11/2014   HCT 44.7 08/11/2014   MCV 92.8 08/11/2014   PLT 172 08/11/2014      Chemistry      Component Value Date/Time   NA 140 08/11/2014 1254   NA 140 01/31/2012 1147   K 4.7 08/11/2014 1254   K 4.5 01/31/2012 1147   CL 106 01/28/2013 0916   CL 106 01/31/2012 1147   CO2 23 08/11/2014 1254   CO2 28 01/31/2012 1147   BUN 16.0 08/11/2014 1254   BUN 15 01/31/2012 1147   CREATININE 1.1 08/11/2014 1254   CREATININE 1.12 01/31/2012 1147      Component Value Date/Time   CALCIUM 9.4 08/11/2014 1254   CALCIUM 9.2 01/31/2012 1147   ALKPHOS 64 08/11/2014 1254   ALKPHOS 53 01/31/2012 1147   AST 21 08/11/2014 1254   AST 24 01/31/2012 1147   ALT 20 08/11/2014 1254   ALT 23 01/31/2012 1147   BILITOT 0.53 08/11/2014 1254   BILITOT 0.6 01/31/2012 1147       RADIOGRAPHIC STUDIES: No recent scans.  Last CT scan of chest, abdomen, and pelvis with IV contrast on 01/28/2013 showed a stable 4 mm right middle lobe nodule, otherwise  negative for any evidence of lymphoma recurrence. A small  umbilical hernia was present. The prostate gland was mildly enlarged.  ASSESSMENT & PLAN:  Lymphoma in remission [name] is now out 7 years from the time of diagnosis without evidence for recurrent lymphoma.  He is clinical doing well without any symptoms. Physical exam and lab are unremarkable. I do not think we need to repeat any further CT scans. He certainly can follow up with his primary care physician but he opts to do yearly follow-up with [place]. I'll see him in a year with lab CBC CMP and LDH.    Orders Placed This Encounter  Procedures  . CBC with Differential  . Comprehensive metabolic panel (Cmet) - [facility]  . Lactate dehydrogenase (LDH) - [facility]   All questions were answered. The patient knows to call the clinic with any problems, questions or concerns. No barriers to learning was detected. I spent 20 minutes counseling the patient face to face. The  total time spent in the appointment was 30 minutes and more than 50% was on counseling and review of test results     Truitt Merle, MD 08/11/2014 2:36 PM

## 2014-08-17 NOTE — Addendum Note (Signed)
Addended by: Truitt Merle on: 08/17/2014 02:55 PM   Modules accepted: Orders

## 2014-08-18 ENCOUNTER — Telehealth: Payer: Self-pay | Admitting: Hematology

## 2014-08-18 NOTE — Telephone Encounter (Signed)
Confirm appt for Nov 2016. mailed cal.

## 2014-08-19 ENCOUNTER — Other Ambulatory Visit: Payer: BC Managed Care – PPO

## 2014-08-19 ENCOUNTER — Ambulatory Visit: Payer: BC Managed Care – PPO

## 2014-10-21 ENCOUNTER — Ambulatory Visit
Admission: RE | Admit: 2014-10-21 | Discharge: 2014-10-21 | Disposition: A | Discharge status: Home / Self Care | Payer: BC Managed Care – PPO | Source: Ambulatory Visit | Attending: Family Medicine | Admitting: Family Medicine

## 2014-10-21 ENCOUNTER — Other Ambulatory Visit: Payer: Self-pay | Admitting: Family Medicine

## 2014-10-21 DIAGNOSIS — Z09 Encounter for follow-up examination after completed treatment for conditions other than malignant neoplasm: Secondary | ICD-10-CM

## 2014-11-03 ENCOUNTER — Telehealth: Payer: Self-pay | Admitting: Hematology

## 2014-11-03 NOTE — Telephone Encounter (Signed)
Left message to confirm change appointment from 11/16 to 11/17

## 2014-12-01 ENCOUNTER — Telehealth: Payer: Self-pay | Admitting: *Deleted

## 2014-12-01 NOTE — Telephone Encounter (Signed)
Patient called requesting his blood type and Rh.  Advised him that we do not have any record of those.  He denied ever having a transfusion here, and that he should contact his primary care physician and make the request.  He needs the information for a medical form for a cruise.

## 2015-08-17 ENCOUNTER — Telehealth: Payer: Self-pay | Admitting: Hematology

## 2015-08-17 ENCOUNTER — Ambulatory Visit: Payer: BC Managed Care – PPO | Admitting: Hematology

## 2015-08-17 ENCOUNTER — Other Ambulatory Visit: Payer: BC Managed Care – PPO

## 2015-08-17 NOTE — Telephone Encounter (Signed)
pt cld to r/s appt again to 11/21-gave updated time & date

## 2015-08-17 NOTE — Telephone Encounter (Signed)
pt cld to r/s appt-gave pt r/s time & date °

## 2015-08-18 ENCOUNTER — Ambulatory Visit: Payer: BC Managed Care – PPO | Admitting: Hematology

## 2015-08-18 ENCOUNTER — Other Ambulatory Visit: Payer: BC Managed Care – PPO

## 2015-08-19 ENCOUNTER — Ambulatory Visit: Payer: BC Managed Care – PPO | Admitting: Hematology

## 2015-08-19 ENCOUNTER — Other Ambulatory Visit: Payer: BC Managed Care – PPO

## 2015-08-22 ENCOUNTER — Ambulatory Visit (HOSPITAL_BASED_OUTPATIENT_CLINIC_OR_DEPARTMENT_OTHER): Payer: BC Managed Care – PPO | Admitting: Hematology

## 2015-08-22 ENCOUNTER — Telehealth: Payer: Self-pay | Admitting: *Deleted

## 2015-08-22 ENCOUNTER — Other Ambulatory Visit (HOSPITAL_BASED_OUTPATIENT_CLINIC_OR_DEPARTMENT_OTHER): Payer: BC Managed Care – PPO

## 2015-08-22 ENCOUNTER — Encounter: Payer: Self-pay | Admitting: Hematology

## 2015-08-22 ENCOUNTER — Telehealth: Payer: Self-pay | Admitting: Hematology

## 2015-08-22 VITALS — BP 141/73 | HR 61 | Temp 98.5°F | Resp 18 | Ht 71.0 in | Wt 191.3 lb

## 2015-08-22 DIAGNOSIS — Z8572 Personal history of non-Hodgkin lymphomas: Secondary | ICD-10-CM

## 2015-08-22 DIAGNOSIS — C8590 Non-Hodgkin lymphoma, unspecified, unspecified site: Secondary | ICD-10-CM

## 2015-08-22 LAB — COMPREHENSIVE METABOLIC PANEL (CC13)
ALT: 23 U/L (ref 0–55)
ANION GAP: 7 meq/L (ref 3–11)
AST: 22 U/L (ref 5–34)
Albumin: 3.7 g/dL (ref 3.5–5.0)
Alkaline Phosphatase: 68 U/L (ref 40–150)
BILIRUBIN TOTAL: 0.6 mg/dL (ref 0.20–1.20)
BUN: 14.6 mg/dL (ref 7.0–26.0)
CO2: 25 mEq/L (ref 22–29)
Calcium: 9.4 mg/dL (ref 8.4–10.4)
Chloride: 107 mEq/L (ref 98–109)
Creatinine: 1 mg/dL (ref 0.7–1.3)
EGFR: 75 mL/min/{1.73_m2} — AB (ref >90)
GLUCOSE: 97 mg/dL (ref 70–140)
POTASSIUM: 4.5 meq/L (ref 3.5–5.1)
Sodium: 139 mEq/L (ref 136–145)
TOTAL PROTEIN: 6.5 g/dL (ref 6.4–8.3)

## 2015-08-22 LAB — CBC WITH DIFFERENTIAL/PLATELET
BASO%: 2.2 % — ABNORMAL HIGH (ref 0.0–2.0)
Basophils Absolute: 0.1 10*3/uL (ref 0.0–0.1)
EOS ABS: 0.7 10*3/uL — AB (ref 0.0–0.5)
EOS%: 10.9 % — ABNORMAL HIGH (ref 0.0–7.0)
HCT: 42.6 % (ref 38.4–49.9)
HGB: 14.2 g/dL (ref 13.0–17.1)
LYMPH%: 25.9 % (ref 14.0–49.0)
MCH: 30.9 pg (ref 27.2–33.4)
MCHC: 33.3 g/dL (ref 32.0–36.0)
MCV: 92.9 fL (ref 79.3–98.0)
MONO#: 0.6 10*3/uL (ref 0.1–0.9)
MONO%: 9.4 % (ref 0.0–14.0)
NEUT#: 3.3 10*3/uL (ref 1.5–6.5)
NEUT%: 51.6 % (ref 39.0–75.0)
PLATELETS: 174 10*3/uL (ref 140–400)
RBC: 4.59 10*6/uL (ref 4.20–5.82)
RDW: 14 % (ref 11.0–14.6)
WBC: 6.3 10*3/uL (ref 4.0–10.3)
lymph#: 1.6 10*3/uL (ref 0.9–3.3)

## 2015-08-22 LAB — LACTATE DEHYDROGENASE (CC13): LDH: 168 U/L (ref 125–245)

## 2015-08-22 NOTE — Telephone Encounter (Signed)
GAVE PATIENT AVS REPORT AND APPOINTMENTS FOR November 2017.

## 2015-08-22 NOTE — Telephone Encounter (Signed)
Called pt on cell phone and left message re:  LDH level normal as per md.

## 2015-08-22 NOTE — Progress Notes (Signed)
Called pt on cell phone and left message re:  LDH level normal as per md.

## 2015-08-22 NOTE — Progress Notes (Signed)
[name] OFFICE PROGRESS NOTE  Patient Care Team: Carol Ada, MD as PCP - General (Family Medicine)  SUMMARY OF ONCOLOGIC HISTORY:   Lymphoma in remission ([place])   08/13/2007 Cancer Diagnosis Large cell B-cell non-Hodgkin's lymphoma, high grade, CD20(+), diagnosed from a large abdominal mass biopsy. Bone marrow on 08/26/2007 was negative. The patient was felt to have stage II disease.    11/05/2007 -  Chemotherapy 6 cycles of Rituxan CHOD from 08/27/2007 through 11/05/2007 with the achievement of a complete remission.   01/31/2012 Initial Diagnosis Lymphoma in remission   Past Medical History  Diagnosis Date  . Non Hodgkin's lymphoma ([place])   . Glaucoma   . Low testosterone      INTERVAL HISTORY: [name] returns for follow-up. He was last seen by me 1 year ago. He has been doing well over the past year. He had episode of upper respiratory infection more months ago, resolved after week. He also had prostatitis a few months ago, was treated by his urologist, resolved now. He denies other episodes of infection, ED visit or hospitalizations in the past year. He has good energy level, eating well, remains physically active. He intentionally lost some weight, no fever, chills, night sweats.   REVIEW OF SYSTEMS:   Constitutional: Denies fevers, chills. (+) 10 lbs intentional weight loss in past year Eyes: Denies blurriness of vision Ears, nose, mouth, throat, and face: Denies mucositis or sore throat Respiratory: Denies cough, dyspnea or wheezes Cardiovascular: Denies palpitation, chest discomfort or lower extremity swelling Gastrointestinal:  Denies nausea, heartburn or change in bowel habits Skin: Denies abnormal skin rashes Lymphatics: Denies new lymphadenopathy or easy bruising Neurological:Denies numbness, tingling or new weaknesses Behavioral/Psych: Mood is stable, no new changes  All other systems were reviewed with the patient and are negative.  I have  reviewed the past medical history, past surgical history, social history and family history with the patient and they are unchanged from previous note.  ALLERGIES:  is allergic to ampicillin.  MEDICATIONS:  Current Outpatient Prescriptions  Medication Sig Dispense Refill  . cholecalciferol (VITAMIN D) 1000 UNITS tablet Take 4,000 Units by mouth daily.    Marland Kitchen CIALIS 20 MG tablet Take 20 mg by mouth as needed.    . latanoprost (XALATAN) 0.005 % ophthalmic solution Place 1 drop into both eyes at bedtime.    . Multiple Vitamin (MULTIVITAMIN) capsule Take 1 capsule by mouth daily.    . vitamin C (ASCORBIC ACID) 250 MG tablet Take 250 mg by mouth daily.    . Zinc Sulfate 220 (50 ZN) MG TABS Take 1 tablet by mouth daily.     No current facility-administered medications for this visit.    PHYSICAL EXAMINATION: ECOG PERFORMANCE STATUS: 0 - Asymptomatic  Filed Vitals:   08/22/15 1048  BP: 141/73  Pulse: 61  Temp: 98.5 F (36.9 C)  Resp: 18   Filed Weights   08/22/15 1048  Weight: 191 lb 4.8 oz (86.773 kg)    GENERAL:alert, no distress and comfortable SKIN: skin color, texture, turgor are normal, no rashes or significant lesions EYES: normal, Conjunctiva are pink and non-injected, sclera clear OROPHARYNX:no exudate, no erythema and lips, buccal mucosa, and tongue normal  NECK: supple, thyroid normal size, non-tender, without nodularity LYMPH:  no palpable lymphadenopathy in the cervical, axillary or inguinal LUNGS: clear to auscultation and percussion with normal breathing effort HEART: regular rate & rhythm and no murmurs and no lower extremity edema ABDOMEN:abdomen soft, non-tender and normal bowel sounds  Musculoskeletal:no cyanosis of digits and no clubbing  NEURO: alert & oriented x 3 with fluent speech, no focal motor/sensory deficits  LABORATORY DATA:  I have reviewed the data as listed CBC Latest Ref Rng 08/22/2015 08/11/2014 08/20/2013  WBC 4.0 - 10.3 10e3/uL 6.3 5.5 6.1   Hemoglobin 13.0 - 17.1 g/dL 14.2 14.8 14.5  Hematocrit 38.4 - 49.9 % 42.6 44.7 42.8  Platelets 140 - 400 10e3/uL 174 172 166    CMP Latest Ref Rng 08/11/2014 08/20/2013 01/28/2013  Glucose 70 - 140 mg/dl 101 96 109(H)  BUN 7.0 - 26.0 mg/dL 16.0 12.8 14.9  Creatinine 0.7 - 1.3 mg/dL 1.1 1.0 1.1  Sodium 136 - 145 mEq/L 140 139 139  Potassium 3.5 - 5.1 mEq/L 4.7 4.4 4.3  Chloride 98 - 107 mEq/L - - 106  CO2 22 - 29 mEq/L 23 23 23   Calcium 8.4 - 10.4 mg/dL 9.4 9.4 9.2  Total Protein 6.4 - 8.3 g/dL 6.3(L) 6.6 6.7  Total Bilirubin 0.20 - 1.20 mg/dL 0.53 0.58 0.50  Alkaline Phos 40 - 150 U/L 64 66 68  AST 5 - 34 U/L 21 26 21   ALT 0 - 55 U/L 20 25 19     RADIOGRAPHIC STUDIES: No recent scans.  Last CT scan of chest, abdomen, and pelvis with IV contrast on 01/28/2013 showed a stable 4 mm right middle lobe nodule, otherwise  negative for any evidence of lymphoma recurrence. A small  umbilical hernia was present. The prostate gland was mildly enlarged.  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  He is clinical doing well without any symptoms. Physical exam and lab are unremarkable. I do not think we need to repeat any further CT scans. He has no significant residual side effects from previous chemotherapy. He has received flu vaccine this year. He would like to continue following with [place]. I'll see him in a year with lab CBC CMP and LDH.  All questions were answered. The patient knows to call the clinic with any problems, questions or concerns. No barriers to learning was detected.  I spent 15 minutes counseling the patient face to face. The total time spent in the appointment was 20 minutes and more than 50% was on counseling and review of test results     Truitt Merle, MD 08/22/2015 11:07 AM

## 2015-09-09 ENCOUNTER — Telehealth: Payer: Self-pay | Admitting: Medical Oncology

## 2015-09-09 NOTE — Telephone Encounter (Signed)
Per Dr Burr Medico , I left the pt a message that his LDH was normal and to call back if he has questions.

## 2016-08-06 ENCOUNTER — Telehealth: Payer: Self-pay | Admitting: Hematology

## 2016-08-06 ENCOUNTER — Telehealth: Payer: Self-pay | Admitting: *Deleted

## 2016-08-06 NOTE — Telephone Encounter (Signed)
FYI "I have an appointment with Dr. Burr Medico I need to change.  Would like November 30 th early morning.  If this morning not available I'd like morning of December 1 st.  Call my work number with new appointment."  Scheduling mesDrinks, cups, icesage sent with this request.

## 2016-08-06 NOTE — Telephone Encounter (Signed)
lvm to inform pt of r/s appts to 11/30 per pt req

## 2016-08-20 ENCOUNTER — Ambulatory Visit: Payer: BC Managed Care – PPO | Admitting: Hematology

## 2016-08-20 ENCOUNTER — Other Ambulatory Visit: Payer: BC Managed Care – PPO

## 2016-08-30 ENCOUNTER — Encounter: Payer: Self-pay | Admitting: Hematology

## 2016-08-30 ENCOUNTER — Other Ambulatory Visit (HOSPITAL_BASED_OUTPATIENT_CLINIC_OR_DEPARTMENT_OTHER): Payer: BC Managed Care – PPO

## 2016-08-30 ENCOUNTER — Telehealth: Payer: Self-pay | Admitting: Hematology

## 2016-08-30 ENCOUNTER — Ambulatory Visit (HOSPITAL_BASED_OUTPATIENT_CLINIC_OR_DEPARTMENT_OTHER): Payer: BC Managed Care – PPO | Admitting: Hematology

## 2016-08-30 VITALS — BP 124/68 | HR 65 | Temp 98.3°F | Resp 17 | Ht 71.0 in | Wt 202.4 lb

## 2016-08-30 DIAGNOSIS — R911 Solitary pulmonary nodule: Secondary | ICD-10-CM | POA: Diagnosis not present

## 2016-08-30 DIAGNOSIS — Z8572 Personal history of non-Hodgkin lymphomas: Secondary | ICD-10-CM

## 2016-08-30 DIAGNOSIS — C8590 Non-Hodgkin lymphoma, unspecified, unspecified site: Secondary | ICD-10-CM

## 2016-08-30 LAB — CBC WITH DIFFERENTIAL/PLATELET
BASO%: 1.3 % (ref 0.0–2.0)
BASOS ABS: 0.1 10*3/uL (ref 0.0–0.1)
EOS ABS: 0.4 10*3/uL (ref 0.0–0.5)
EOS%: 5.2 % (ref 0.0–7.0)
HCT: 43.6 % (ref 38.4–49.9)
HGB: 14.6 g/dL (ref 13.0–17.1)
LYMPH%: 25.8 % (ref 14.0–49.0)
MCH: 30.4 pg (ref 27.2–33.4)
MCHC: 33.4 g/dL (ref 32.0–36.0)
MCV: 91 fL (ref 79.3–98.0)
MONO#: 0.7 10*3/uL (ref 0.1–0.9)
MONO%: 9.3 % (ref 0.0–14.0)
NEUT#: 4.5 10*3/uL (ref 1.5–6.5)
NEUT%: 58.4 % (ref 39.0–75.0)
PLATELETS: 332 10*3/uL (ref 140–400)
RBC: 4.79 10*6/uL (ref 4.20–5.82)
RDW: 13.1 % (ref 11.0–14.6)
WBC: 7.7 10*3/uL (ref 4.0–10.3)
lymph#: 2 10*3/uL (ref 0.9–3.3)

## 2016-08-30 LAB — COMPREHENSIVE METABOLIC PANEL
ALT: 46 U/L (ref 0–55)
ANION GAP: 9 meq/L (ref 3–11)
AST: 26 U/L (ref 5–34)
Albumin: 3.2 g/dL — ABNORMAL LOW (ref 3.5–5.0)
Alkaline Phosphatase: 73 U/L (ref 40–150)
BUN: 16.4 mg/dL (ref 7.0–26.0)
CALCIUM: 9.3 mg/dL (ref 8.4–10.4)
CHLORIDE: 107 meq/L (ref 98–109)
CO2: 25 mEq/L (ref 22–29)
CREATININE: 1 mg/dL (ref 0.7–1.3)
EGFR: 70 mL/min/{1.73_m2} — ABNORMAL LOW (ref >90)
Glucose: 119 mg/dl (ref 70–140)
Potassium: 4 mEq/L (ref 3.5–5.1)
Sodium: 141 mEq/L (ref 136–145)
Total Bilirubin: 0.56 mg/dL (ref 0.20–1.20)
Total Protein: 6.6 g/dL (ref 6.4–8.3)

## 2016-08-30 LAB — LACTATE DEHYDROGENASE: LDH: 186 U/L (ref 125–245)

## 2016-08-30 NOTE — Telephone Encounter (Signed)
Gave patient avs report and appointments for November 2018.  °

## 2016-08-30 NOTE — Progress Notes (Signed)
[name] OFFICE PROGRESS NOTE  Patient Care Team: Timothy Ada, MD as PCP - General (Family Medicine)  SUMMARY OF ONCOLOGIC HISTORY:   Lymphoma in remission ([place])   08/13/2007 Cancer Diagnosis    Large cell B-cell non-Hodgkin's lymphoma, high grade, CD20(+), diagnosed from a large abdominal mass biopsy. Bone marrow on 08/26/2007 was negative. The patient was felt to have stage II disease.       11/05/2007 -  Chemotherapy    6 cycles of Rituxan CHOD from 08/27/2007 through 11/05/2007 with the achievement of a complete remission.      01/31/2012 Initial Diagnosis    Lymphoma in remission      Past Medical History:  Diagnosis Date  . Glaucoma   . Low testosterone   . Non Hodgkin's lymphoma ([place])      INTERVAL HISTORY: [name] returns for follow-up. He was last seen by me 1 year ago. 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 No ED visit or hospitalization in the past year.   REVIEW OF SYSTEMS:   Constitutional: Denies fevers, chills. No weight loss  Eyes: Denies blurriness of vision Ears, nose, mouth, throat, and face: Denies mucositis or sore throat Respiratory: Denies cough, dyspnea or wheezes Cardiovascular: Denies palpitation, chest discomfort or lower extremity swelling Gastrointestinal:  Denies nausea, heartburn or change in bowel habits Skin: Denies abnormal skin rashes Lymphatics: Denies new lymphadenopathy or easy bruising Neurological:Denies numbness, tingling or new weaknesses Behavioral/Psych: Mood is stable, no new changes  All other systems were reviewed with the patient and are negative.  I have reviewed the past medical history, past surgical history, social history and family history with the patient and they are unchanged from previous note.  ALLERGIES:  is allergic to amoxicillin;  ampicillin; and penicillin g.  MEDICATIONS:  Current Outpatient Prescriptions  Medication Sig Dispense Refill  . cholecalciferol (VITAMIN D) 1000 UNITS tablet Take 4,000 Units by mouth daily.    Marland Kitchen CIALIS 20 MG tablet Take 20 mg by mouth as needed.    . latanoprost (XALATAN) 0.005 % ophthalmic solution Place 1 drop into both eyes at bedtime.    . Multiple Vitamin (MULTIVITAMIN) capsule Take 1 capsule by mouth daily.    . vitamin C (ASCORBIC ACID) 250 MG tablet Take 250 mg by mouth daily.    . Zinc Sulfate 220 (50 ZN) MG TABS Take 1 tablet by mouth daily.     No current facility-administered medications for this visit.     PHYSICAL EXAMINATION: ECOG PERFORMANCE STATUS: 0 - Asymptomatic  Vitals:   08/30/16 0904  BP: 124/68  Pulse: 65  Resp: 17  Temp: 98.3 F (36.8 C)   Filed Weights   08/30/16 0904  Weight: 202 lb 6.4 oz (91.8 kg)    GENERAL:alert, no distress and comfortable SKIN: skin color, texture, turgor are normal, no rashes or significant lesions EYES: normal, Conjunctiva are pink and non-injected, sclera clear OROPHARYNX:no exudate, no erythema and lips, buccal mucosa, and tongue normal  NECK: supple, thyroid normal size, non-tender, without nodularity LYMPH:  no palpable lymphadenopathy in the cervical, axillary or inguinal LUNGS: clear to auscultation and percussion with normal breathing effort HEART: regular rate & rhythm and no murmurs and no lower extremity edema ABDOMEN:abdomen soft, non-tender and normal bowel sounds Musculoskeletal:no cyanosis of digits and no clubbing  NEURO:  alert & oriented x 3 with fluent speech, no focal motor/sensory deficits  LABORATORY DATA:  I have reviewed the data as listed CBC Latest Ref Rng & Units 08/30/2016 08/22/2015 08/11/2014  WBC 4.0 - 10.3 10e3/uL 7.7 6.3 5.5  Hemoglobin 13.0 - 17.1 g/dL 14.6 14.2 14.8  Hematocrit 38.4 - 49.9 % 43.6 42.6 44.7  Platelets 140 - 400 10e3/uL 332 174 172    CMP Latest Ref Rng & Units  08/30/2016 08/22/2015 08/11/2014  Glucose 70 - 140 mg/dl 119 97 101  BUN 7.0 - 26.0 mg/dL 16.4 14.6 16.0  Creatinine 0.7 - 1.3 mg/dL 1.0 1.0 1.1  Sodium 136 - 145 mEq/L 141 139 140  Potassium 3.5 - 5.1 mEq/L 4.0 4.5 4.7  Chloride 98 - 107 mEq/L - - -  CO2 22 - 29 mEq/L 25 25 23   Calcium 8.4 - 10.4 mg/dL 9.3 9.4 9.4  Total Protein 6.4 - 8.3 g/dL 6.6 6.5 6.3(L)  Total Bilirubin 0.20 - 1.20 mg/dL 0.56 0.60 0.53  Alkaline Phos 40 - 150 U/L 73 68 64  AST 5 - 34 U/L 26 22 21   ALT 0 - 55 U/L 46 23 20   LDH 186 normal   RADIOGRAPHIC STUDIES: No recent scans.  Last CT scan of chest, abdomen, and pelvis with IV contrast on 01/28/2013 showed a stable 4 mm right middle lobe nodule, otherwise  negative for any evidence of lymphoma recurrence. A small  umbilical hernia was present. The prostate gland was mildly enlarged.  ASSESSMENT & PLAN:  75 yo male   1. Diffuse large B cell Lymphoma, stage II,  in remission Professor [name] is now out 9 years from the time of diagnosis without evidence for recurrent lymphoma. I do not think he is at risk for recurrence anymore.  He is clinical doing well without any symptoms. Physical exam and lab are unremarkable. I do not think we need to repeat any further CT scans. He has no significant residual side effects from previous chemotherapy. He has received flu vaccine this year. He would like to continue following with [place]. I'll see him in a year with lab CBC CMP and LDH.  2. Cancer screening  -He has had PSA screening for prostate cancer. -He has not had screening colonoscopy, I encouraged him to consider colonoscopy, or stool test for colon cancer screening.   All questions were answered. The patient knows to call the clinic with any problems, questions or concerns. No barriers to learning was detected.  I spent 15 minutes counseling the patient face to face. The total time spent in the appointment was 20 minutes and more than 50% was on  counseling and review of test results     Truitt Merle, MD 08/30/16

## 2017-08-06 ENCOUNTER — Encounter: Payer: Self-pay | Admitting: Radiation Oncology

## 2017-08-26 ENCOUNTER — Ambulatory Visit: Payer: BC Managed Care – PPO

## 2017-08-26 ENCOUNTER — Ambulatory Visit: Payer: BC Managed Care – PPO | Admitting: Radiation Oncology

## 2017-08-28 NOTE — Progress Notes (Signed)
[name] OFFICE PROGRESS NOTE  Patient Care Team: Carol Ada, MD as PCP - General (Family Medicine)  SUMMARY OF ONCOLOGIC HISTORY:   Lymphoma in remission ([place])   08/13/2007 Cancer Diagnosis    Large cell B-cell non-Hodgkin's lymphoma, high grade, CD20(+), diagnosed from a large abdominal mass biopsy. Bone marrow on 08/26/2007 was negative. The patient was felt to have stage II disease.       11/05/2007 -  Chemotherapy    6 cycles of Rituxan CHOD from 08/27/2007 through 11/05/2007 with the achievement of a complete remission.      01/31/2012 Initial Diagnosis    Lymphoma in remission      Past Medical History:  Diagnosis Date  . Glaucoma   . Low testosterone   . Non Hodgkin's lymphoma ([place])   . Prostate cancer ([place])     CURRENT THERAPY: Surveillance    INTERVAL HISTORY:  [name] returns for follow-up. [name] was last seen by me 1 year ago. [name] presents to the clinic today noting her had thanksgiving in [place]. Her reports his PSA screening was previously elevated and [name] had a biopsy done. 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 Biopsy showed a small tumor in his prostate. It appears to be slow growing and is borderline needing intervention. [name] will be seeing Dr. Tammi Klippel for radiation. Dr. Adele Schilder said if [name] was younger [name] would recommend surgery. [name] was thinking [name] could get a second opinion from me. [name] will be seeing Dr. Tammi Klippel today. [name] denies fever or chills. [name] notes weight gain and would like to loose some weight. [name] sees his PCP regularly. [name] received his flu shot in October for this year.    REVIEW OF SYSTEMS:   Constitutional: Denies fevers, chills. (+) weight gain  Eyes: Denies blurriness of vision Ears, nose, mouth, throat, and face: Denies mucositis or sore throat Respiratory: Denies cough, dyspnea or wheezes Cardiovascular: Denies palpitation, chest discomfort or lower extremity swelling Gastrointestinal:  Denies nausea,  heartburn or change in bowel habits Skin: Denies abnormal skin rashes Lymphatics: Denies new lymphadenopathy or easy bruising Neurological:Denies numbness, tingling or new weaknesses Behavioral/Psych: Mood is stable, no new changes  All other systems were reviewed with the patient and are negative.  I have reviewed the past medical history, past surgical history, social history and family history with the patient and they are unchanged from previous note.  ALLERGIES:  is allergic to amoxicillin; ampicillin; and penicillin g.  MEDICATIONS:  Current Outpatient Medications  Medication Sig Dispense Refill  . cholecalciferol (VITAMIN D) 1000 UNITS tablet Take 4,000 Units by mouth daily.    Marland Kitchen latanoprost (XALATAN) 0.005 % ophthalmic solution Place 1 drop into both eyes at bedtime.    . Multiple Vitamin (MULTIVITAMIN) capsule Take 1 capsule by mouth daily.    . vitamin C (ASCORBIC ACID) 250 MG tablet Take 250 mg by mouth daily.    . Zinc Sulfate 220 (50 ZN) MG TABS Take 1 tablet by mouth daily.     No current facility-administered medications for this visit.     PHYSICAL EXAMINATION: ECOG PERFORMANCE STATUS: 0 - Asymptomatic  Vitals:   08/29/17 1043  BP: 132/70  Pulse: 62  Resp: 18  Temp: 98.5 F (36.9 C)  SpO2: 97%   Filed Weights   08/29/17 1043  Weight: 204 lb 9.6 oz (92.8 kg)    GENERAL:alert, no distress and comfortable SKIN: skin color, texture, turgor are normal, no rashes or  significant lesions EYES: normal, Conjunctiva are pink and non-injected, sclera clear OROPHARYNX:no exudate, no erythema and lips, buccal mucosa, and tongue normal  NECK: supple, thyroid normal size, non-tender, without nodularity LYMPH:  no palpable lymphadenopathy in the cervical, axillary or inguinal LUNGS: clear to auscultation and percussion with normal breathing effort HEART: regular rate & rhythm and no murmurs and no lower extremity edema ABDOMEN:abdomen soft, non-tender and normal bowel  sounds Musculoskeletal:no cyanosis of digits and no clubbing  NEURO: alert & oriented x 3 with fluent speech, no focal motor/sensory deficits  LABORATORY DATA:  I have reviewed the data as listed CBC Latest Ref Rng & Units 08/29/2017 08/30/2016 08/22/2015  WBC 4.0 - 10.3 10e3/uL 6.2 7.7 6.3  Hemoglobin 13.0 - 17.1 g/dL 14.8 14.6 14.2  Hematocrit 38.4 - 49.9 % 42.1 43.6 42.6  Platelets 140 - 400 10e3/uL 171 332 174    CMP Latest Ref Rng & Units 08/29/2017 08/30/2016 08/22/2015  Glucose 70 - 140 mg/dl 106 119 97  BUN 7.0 - 26.0 mg/dL 16.8 16.4 14.6  Creatinine 0.7 - 1.3 mg/dL 1.0 1.0 1.0  Sodium 136 - 145 mEq/L 142 141 139  Potassium 3.5 - 5.1 mEq/L 4.1 4.0 4.5  Chloride 98 - 107 mEq/L - - -  CO2 22 - 29 mEq/L 24 25 25   Calcium 8.4 - 10.4 mg/dL 9.1 9.3 9.4  Total Protein 6.4 - 8.3 g/dL 6.7 6.6 6.5  Total Bilirubin 0.20 - 1.20 mg/dL 0.69 0.56 0.60  Alkaline Phos 40 - 150 U/L 69 73 68  AST 5 - 34 U/L 24 26 22   ALT 0 - 55 U/L 23 46 23   LDH 186 normal PENDING for 08/29/17   RADIOGRAPHIC STUDIES: No recent scans.  Last CT scan of chest, abdomen, and pelvis with IV contrast on 01/28/2013 showed a stable 4 mm right middle lobe nodule, otherwise  negative for any evidence of lymphoma recurrence. A small  umbilical hernia was present. The prostate gland was mildly enlarged.  ASSESSMENT & PLAN:  76 yo male   1. Diffuse large B cell Lymphoma, stage II,  in remission -[name] is now out 9 years from the time of diagnosis without evidence for recurrent lymphoma. I do not think [name] is at risk for recurrence anymore.  -[name] is clinical doing well without any symptoms. Physical exam and lab are unremarkable. I do not think we need to repeat any further CT scans. -[name] has no significant residual side effects from previous chemotherapy. -labs reviewed, CBC is WNL, LDH and blood chemistries are still pending. I will notify him with results -It has been 10 years since diagnosis, I  offered releasing him from my care as [name] has been in stable remission for several years. [name] would like to continue following with [place] -I'll see him in a year with lab CBC CMP and LDH.  2. Prostate cancer -due to his elevated PSA, [name] underwent prostate biopsy, which showed prostate cancer -She is scheduled to see Dr. Tammi Klippel for radiation today -She will follow-up with Dr. Risa Grill  3. Cancer screening  -[name] has not had screening colonoscopy, I encouraged him to consider colonoscopy, or stool test for colon cancer screening.    All questions were answered. The patient knows to call the clinic with any problems, questions or concerns. No barriers to learning was detected.  I spent 15 minutes counseling the patient face to face. The total time spent in the appointment was 20 minutes and more than 50% was  on counseling and review of test results     Truitt Merle, MD 08/29/2017    This document serves as a record of services personally performed by Truitt Merle, MD. It was created on her behalf by Joslyn Devon, a trained medical scribe. The creation of this record is based on the scribe's personal observations and the provider's statements to them.    I have reviewed the above documentation for accuracy and completeness, and I agree with the above.

## 2017-08-29 ENCOUNTER — Ambulatory Visit: Payer: BC Managed Care – PPO | Admitting: Hematology

## 2017-08-29 ENCOUNTER — Other Ambulatory Visit (HOSPITAL_BASED_OUTPATIENT_CLINIC_OR_DEPARTMENT_OTHER): Payer: BC Managed Care – PPO

## 2017-08-29 ENCOUNTER — Other Ambulatory Visit: Payer: Self-pay

## 2017-08-29 ENCOUNTER — Ambulatory Visit
Admission: RE | Admit: 2017-08-29 | Discharge: 2017-08-29 | Disposition: A | Discharge status: Home / Self Care | Payer: BC Managed Care – PPO | Source: Ambulatory Visit | Attending: Radiation Oncology | Admitting: Radiation Oncology

## 2017-08-29 ENCOUNTER — Encounter: Payer: Self-pay | Admitting: Radiation Oncology

## 2017-08-29 ENCOUNTER — Telehealth: Payer: Self-pay | Admitting: Nurse Practitioner

## 2017-08-29 ENCOUNTER — Encounter: Payer: Self-pay | Admitting: Medical Oncology

## 2017-08-29 ENCOUNTER — Encounter: Payer: Self-pay | Admitting: Hematology

## 2017-08-29 VITALS — BP 132/70 | HR 62 | Temp 98.5°F | Resp 18 | Ht 71.0 in | Wt 204.6 lb

## 2017-08-29 VITALS — BP 129/78 | HR 64 | Temp 98.0°F | Resp 18 | Ht 72.0 in | Wt 204.8 lb

## 2017-08-29 DIAGNOSIS — N411 Chronic prostatitis: Secondary | ICD-10-CM | POA: Diagnosis not present

## 2017-08-29 DIAGNOSIS — Z87891 Personal history of nicotine dependence: Secondary | ICD-10-CM | POA: Diagnosis not present

## 2017-08-29 DIAGNOSIS — C61 Malignant neoplasm of prostate: Secondary | ICD-10-CM

## 2017-08-29 DIAGNOSIS — Z79899 Other long term (current) drug therapy: Secondary | ICD-10-CM | POA: Diagnosis not present

## 2017-08-29 DIAGNOSIS — Z8572 Personal history of non-Hodgkin lymphomas: Secondary | ICD-10-CM | POA: Insufficient documentation

## 2017-08-29 DIAGNOSIS — Z880 Allergy status to penicillin: Secondary | ICD-10-CM | POA: Diagnosis not present

## 2017-08-29 DIAGNOSIS — Z809 Family history of malignant neoplasm, unspecified: Secondary | ICD-10-CM | POA: Diagnosis not present

## 2017-08-29 DIAGNOSIS — C8590 Non-Hodgkin lymphoma, unspecified, unspecified site: Secondary | ICD-10-CM

## 2017-08-29 DIAGNOSIS — Z833 Family history of diabetes mellitus: Secondary | ICD-10-CM | POA: Diagnosis not present

## 2017-08-29 HISTORY — DX: Malignant neoplasm of prostate: C61

## 2017-08-29 LAB — COMPREHENSIVE METABOLIC PANEL
ALT: 23 U/L (ref 0–55)
AST: 24 U/L (ref 5–34)
Albumin: 3.9 g/dL (ref 3.5–5.0)
Alkaline Phosphatase: 69 U/L (ref 40–150)
Anion Gap: 8 mEq/L (ref 3–11)
BILIRUBIN TOTAL: 0.69 mg/dL (ref 0.20–1.20)
BUN: 16.8 mg/dL (ref 7.0–26.0)
CO2: 24 meq/L (ref 22–29)
CREATININE: 1 mg/dL (ref 0.7–1.3)
Calcium: 9.1 mg/dL (ref 8.4–10.4)
Chloride: 109 mEq/L (ref 98–109)
EGFR: >60 mL/min/{1.73_m2} (ref >60)
GLUCOSE: 106 mg/dL (ref 70–140)
Potassium: 4.1 mEq/L (ref 3.5–5.1)
SODIUM: 142 meq/L (ref 136–145)
TOTAL PROTEIN: 6.7 g/dL (ref 6.4–8.3)

## 2017-08-29 LAB — CBC WITH DIFFERENTIAL/PLATELET
BASO%: 0.6 % (ref 0.0–2.0)
Basophils Absolute: 0 10*3/uL (ref 0.0–0.1)
EOS%: 5 % (ref 0.0–7.0)
Eosinophils Absolute: 0.3 10*3/uL (ref 0.0–0.5)
HCT: 42.1 % (ref 38.4–49.9)
HGB: 14.8 g/dL (ref 13.0–17.1)
LYMPH%: 21.4 % (ref 14.0–49.0)
MCH: 31.9 pg (ref 27.2–33.4)
MCHC: 35.2 g/dL (ref 32.0–36.0)
MCV: 90.7 fL (ref 79.3–98.0)
MONO#: 0.7 10*3/uL (ref 0.1–0.9)
MONO%: 12 % (ref 0.0–14.0)
NEUT%: 61 % (ref 39.0–75.0)
NEUTROS ABS: 3.8 10*3/uL (ref 1.5–6.5)
Platelets: 171 10*3/uL (ref 140–400)
RBC: 4.64 10*6/uL (ref 4.20–5.82)
RDW: 13 % (ref 11.0–14.6)
WBC: 6.2 10*3/uL (ref 4.0–10.3)
lymph#: 1.3 10*3/uL (ref 0.9–3.3)

## 2017-08-29 LAB — LACTATE DEHYDROGENASE: LDH: 167 U/L (ref 125–245)

## 2017-08-29 NOTE — Telephone Encounter (Signed)
Gave avs and calendar for November 2019 °

## 2017-08-29 NOTE — Progress Notes (Signed)
GU Location of Tumor / Histology: prostatic adenocarcinoma  If Prostate Cancer, Gleason Score is (3 + 4) and PSA is (7.57)09/2015. Prostate volume: 53 grams.  [name] was diagnosed with low volume but intermediate risk prostate cancer in February 2017.   Biopsies of prostate (if applicable) revealed:    Past/Anticipated interventions by urology, if any: prostate biopsy, active surveillance, referral to Dr. Tammi Klippel to discuss external beam therapy vs brachytherapy  Past/Anticipated interventions by medical oncology, if any: no  Weight changes, if any: no  Bowel/Bladder complaints, if any: IPSS 7. Reports frequency, nocturia and ED. Denies dysuria or hematuria. Reports rare urinary leakage.   Nausea/Vomiting, if any: no  Pain issues, if any:  no  SAFETY ISSUES:  Prior radiation? no  Pacemaker/ICD? no  Possible current pregnancy? no  Is the patient on methotrexate? no  Current Complaints / other details:  76 year old male. Divorced. 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 Consulting with [employer] about [employer] [phone] mcg. Enjoys cajun music and dance.

## 2017-08-29 NOTE — Progress Notes (Signed)
[name] here today to consult with Dr. Tammi Klippel about his prostate cancer. I know him from being a patient of Dr. Rolan Bucco when he was diagnosed and treatment for lymphoma. I discussed my new role as the prostate nurse navigator. I will continue to follow.

## 2017-08-29 NOTE — Progress Notes (Signed)
See progress note under physician encounter. 

## 2017-08-29 NOTE — Progress Notes (Signed)
Radiation Oncology         [phone] ________________________________  Initial outpatient Consultation  Name: [name] MRN: [id number]  Date: 08/29/2017  DOB: Mar 04, 1941  RP:RXYVO, [name], MD  Rana Snare, MD   REFERRING PHYSICIAN: Rana Snare, MD  DIAGNOSIS: 76 y.o. gentleman with stage T1c adenocarcinoma of the prostate with a Gleason's score of 3+4 and a PSA of 10.2    ICD-10-CM   1. Malignant neoplasm of prostate ([place]) C61   2. Prostate cancer ([place]) C61     HISTORY OF PRESENT ILLNESS::[name] is a 76 y.o. gentleman with a history of elevated PSA and chronic prostatitis followed by Dr. Risa Grill at [facility] since 2015.  He was initially diagnosed with low volume, Gleason 3+4 prostate cancer on TRUSPBx performed 11/21/2015 due to an elevated PSA of 7.6.  Out of 12 core biopsies, one was positive.  The maximum Gleason score was 3+4=7, and this was seen in the left prostate. The right prostate showed a high grade prostatic intraepithelial neoplasia only. The prostate volume measured 53 grams. Due to the very low volume of disease, the patient elected to proceed with active surveillance.  PSA in 07/2016 declined slightly to 6.9 but has subsequently continued on a gradual rise with a current PSA of 10.2.  Due to the continued rise in PSA, now above 10, he has been recommended to consider treatment.  The patient reviewed the biopsy and PSA results with his urologist and he has kindly been referred today for discussion of potential radiation treatment options.   PSA  07/22/17 - 10.2 02/12/17 - 8.85 01/14/17 - 8.88  07/30/16 - 6.87  09/2015 - 7.57  Patient has a remote history of Large cell B-cell non Hodgkin's lymphoma, high grade, CD20 (+), diagnosed in 2008 from a large abdominal mass biospy. Bone marrow on 08/26/2007 was negative.  Patient did recieve 6 cycles of Rituxan CHOD chemotherapy from 08/27/2007 through 11/05/2007, with the achievement of a complete  remission. He has not shown evidence of active disease since that time. He is followed with Dr. Burr Medico.  PREVIOUS RADIATION THERAPY: No  PAST MEDICAL HISTORY:  has a past medical history of Glaucoma, Low testosterone, Non Hodgkin's lymphoma ([place]), and Prostate cancer ([place]).    PAST SURGICAL HISTORY: Past Surgical History:  Procedure Laterality Date  . PROSTATE BIOPSY      FAMILY HISTORY: family history includes Cancer in his brother; Diabetes in his mother; Glaucoma in his brother.  SOCIAL HISTORY:  reports that he has quit smoking. His smoking use included cigars. he has never used smokeless tobacco. He reports that he drinks alcohol. He reports that he does not use drugs. He is a Orthoptist at [employer].  Divorced. Enjoys cajun music and dance. He travels to [place], [place] frequently for [employer].  ALLERGIES: Amoxicillin; Ampicillin; and Penicillin g  MEDICATIONS:  Current Outpatient Medications  Medication Sig Dispense Refill  . cholecalciferol (VITAMIN D) 1000 UNITS tablet Take 4,000 Units by mouth daily.    Marland Kitchen latanoprost (XALATAN) 0.005 % ophthalmic solution Place 1 drop into both eyes at bedtime.    . Multiple Vitamin (MULTIVITAMIN) capsule Take 1 capsule by mouth daily.    . vitamin C (ASCORBIC ACID) 250 MG tablet Take 250 mg by mouth daily.    . Zinc Sulfate 220 (50 ZN) MG TABS Take 1 tablet by mouth daily.     No current facility-administered medications for this encounter.     REVIEW OF  SYSTEMS:  On review of systems, the patient reports that he is doing well overall. He denies any chest pain, shortness of breath, cough, fevers, chills, night sweats, unintended weight changes. He denies any bowel or bladder disturbances, and denies abdominal pain, nausea or vomiting. He denies any new musculoskeletal or joint aches or pains, new skin lesions or concerns. He completed an IPSS and IIEF questionnaire.  His IPSS score was 7 indicating mild urinary outflow  obstructive symptoms with urinary frequency an nocturia. He denies dysuria or hematuria, and reports only occasional urinary leakage if he postpones urination too long.  He indicated that his erectile function is able to complete sexual activity with the use of MUSE. He has not had good success with Viagra or oral PDE5 inhibitors previously.  A complete review of systems is obtained and is otherwise negative.  PHYSICAL EXAM:    height is 6' (1.829 m) and weight is 204 lb 12.8 oz (92.9 kg). His oral temperature is 98 F (36.7 C). His blood pressure is 129/78 and his pulse is 64. His respiration is 18 and oxygen saturation is 100%.   In general this is a well appearing caucasian male in no acute distress. He is alert and oriented x4 and appropriate throughout the examination. HEENT reveals that the patient is normocephalic, atraumatic. EOMs are intact. PERRLA. Skin is intact without any evidence of gross lesions. Cardiovascular exam reveals a regular rate and rhythm, no clicks rubs or murmurs are auscultated. Chest is clear to auscultation bilaterally. Lymphatic assessment is performed and does not reveal any adenopathy in the cervical, supraclavicular, axillary, or inguinal chains. Abdomen has active bowel sounds in all quadrants and is intact. The abdomen is soft, non tender, non distended. Lower extremities are negative for pretibial pitting edema, deep calf tenderness, cyanosis or clubbing.  KPS = 100  100 - Normal; no complaints; no evidence of disease. 90   - Able to carry on normal activity; minor signs or symptoms of disease. 80   - Normal activity with effort; some signs or symptoms of disease. 78   - Cares for self; unable to carry on normal activity or to do active work. 60   - Requires occasional assistance, but is able to care for most of his personal needs. 50   - Requires considerable assistance and frequent medical care. 103   - Disabled; requires special care and assistance. 69   -  Severely disabled; hospital admission is indicated although death not imminent. 74   - Very sick; hospital admission necessary; active supportive treatment necessary. 10   - Moribund; fatal processes progressing rapidly. 0     - Dead  Karnofsky DA, Abelmann Megargel, Craver LS and Burchenal [facility] [phone]) The use of the nitrogen mustards in the palliative treatment of carcinoma: with particular reference to bronchogenic carcinoma Cancer 1 634-56   LABORATORY DATA:  Lab Results  Component Value Date   WBC 6.2 08/29/2017   HGB 14.8 08/29/2017   HCT 42.1 08/29/2017   MCV 90.7 08/29/2017   PLT 171 08/29/2017   Lab Results  Component Value Date   NA 142 08/29/2017   K 4.1 08/29/2017   CL 106 01/28/2013   CO2 24 08/29/2017   Lab Results  Component Value Date   ALT 23 08/29/2017   AST 24 08/29/2017   ALKPHOS 69 08/29/2017   BILITOT 0.69 08/29/2017     RADIOGRAPHY: No results found.    IMPRESSION/ PLAN:  76 year old gentleman with Stage  T1c adenocarcinoma of the prostate with Gleason Score of 3+4, and PSA of 10.2.  His T-Stage, Gleason's Score, and PSA put him into the intermediate risk group.  Accordingly he is eligible for a variety of potential treatment options including prostate seed implant and external beam radiation.  Today, we talked to the patient about the findings and work-up thus far.  We discussed the natural history of the prostate cancer and general treatment, highlighting the role of radiotherapy in the management.  We discussed the available radiation techniques, and focused on the details of logistics and delivery.  We discussed and outlined the risks, benefits, short and long-term effects associated with radiotherapy and compared and contrasted these with prostatectomy. We also discussed the role of SpaceOAR in reducing the rectal toxicity associated with radiotherapy.   At the end of our discussion, the patient remains undecided regarding his preferred option for treatment. He  is leaning towards brachytherapy and use of SpaceOAR to reduce rectal toxicity from radiotherapy but would like to look at his schedule regarding the timing of this procedure as to not interfere with his field research in [place].  We will plan to follow up with him by phone within the next 1-2 weeks regarding his treatment preference and will share our discussion with Dr. Risa Grill to keep him apprised of the patient's decision for scheduling going forward.  The patient met briefly with Romie Jumper in our office who will be working closely with him to coordinate OR scheduling and pre and post procedure appointments should he elect to proceed with brachytherapy.  In that case, we will contact the pharmaceutical rep to ensure that [place] is available at the time of procedure and will coordinate a prostate MRI following his post-seed CT SIM to confirm appropriate distribution of the [place].    Nicholos Johns, PA-C    Tyler Pita, MD  [facility] Direct Dial: [phone]  Fax: [phone] [email or web address]  Skype  LinkedIn     This document serves as a record of services personally performed by Tyler Pita, MD and Ashlyn Bruning PA-C. It was created on their behalf by Delton Coombes, a trained medical scribe. The creation of this record is based on the scribe's personal observations and the provider's statements to them.

## 2017-09-13 ENCOUNTER — Telehealth: Payer: Self-pay | Admitting: Urology

## 2017-09-13 NOTE — Telephone Encounter (Signed)
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 He reports that he is getting very close to finalize his decision regarding treatment preference for his prostate cancer and continues to lean heavily towards brachytherapy. He has a few friends that have gone through the procedure that he would like to talk with before committing. He anticipates being able to finalize his decision next week and will call back to let me know his preference. I offered to answer any additional questions that he might have. I will look forward to hearing back from him next week and will move forward and assist with making the appropriate arrangements based on his final decision.    Nicholos Johns, PA-C

## 2017-11-04 ENCOUNTER — Telehealth: Payer: Self-pay | Admitting: Medical Oncology

## 2017-11-04 NOTE — Telephone Encounter (Signed)
Left a message asking for a return call regarding his treatment decision for his prostate cancer. I asked him to call me or Ashlyn-PA to discuss.

## 2017-11-04 NOTE — Telephone Encounter (Signed)
[name] called stating that he is still considering seed implant. He has done more research and discussed with friends that have had the procedure. He states he is just worried about side effects and worried about diarrhea. We discussed  side effect and the use of SpaceOAR that has helped to reduce rectal toxicity.  He states that he needs a few more days to make his final decision. I asked him to call me or Ashlyn with further questions or his final decision. He voiced understanding.

## 2017-11-11 ENCOUNTER — Telehealth: Payer: Self-pay | Admitting: Medical Oncology

## 2017-11-11 NOTE — Telephone Encounter (Signed)
Patient called left a message stating he had attempted to reach Ashlyn but did not think he had reached the correct number. He left a message that he ready to be scheduled for his CT. I returned the call, leaving a voice mail that I will call him in the morning or have Ashlyn call him to set up the CT.

## 2017-11-12 ENCOUNTER — Telehealth: Payer: Self-pay | Admitting: Medical Oncology

## 2017-11-12 NOTE — Telephone Encounter (Signed)
Left a message with [name] asking him to return my call to confirm his treatment decision.

## 2017-11-13 ENCOUNTER — Telehealth: Payer: Self-pay | Admitting: *Deleted

## 2017-11-13 NOTE — Telephone Encounter (Signed)
Called patient to ask questions, lvm for a return call

## 2017-11-14 NOTE — Telephone Encounter (Signed)
Spoke with patient and he has decided to proceed with brachytherapy. He informed me that he saw Dr. Risa Grill but has been assigned to Dr. Lovena Neighbours at [facility]. I informed him that I would share his decision with Ashlyn and Dr. Tammi Klippel and Enid Derry will call him to begin scheduling appointments for his seed implant. He voiced understanding.

## 2017-12-04 ENCOUNTER — Telehealth: Payer: Self-pay | Admitting: *Deleted

## 2017-12-04 NOTE — Telephone Encounter (Signed)
CALLED PATIENT TO INFORM OF PRE-SEED PLANNING CT ON 12-11-17, SPOKE WITH PATIENT AND HE IS AWARE OF THESE APPTS.

## 2017-12-06 ENCOUNTER — Ambulatory Visit: Payer: Self-pay | Admitting: Radiation Oncology

## 2017-12-06 ENCOUNTER — Ambulatory Visit: Payer: BC Managed Care – PPO | Admitting: Radiation Oncology

## 2017-12-10 ENCOUNTER — Telehealth: Payer: Self-pay | Admitting: *Deleted

## 2017-12-10 NOTE — Telephone Encounter (Signed)
Called patient to ask question, lvm for a return call 

## 2017-12-11 ENCOUNTER — Ambulatory Visit
Admission: RE | Admit: 2017-12-11 | Discharge: 2017-12-11 | Disposition: A | Discharge status: Home / Self Care | Payer: BC Managed Care – PPO | Source: Ambulatory Visit | Attending: Radiation Oncology | Admitting: Radiation Oncology

## 2017-12-11 DIAGNOSIS — C61 Malignant neoplasm of prostate: Secondary | ICD-10-CM | POA: Insufficient documentation

## 2017-12-11 NOTE — Progress Notes (Signed)
  Radiation Oncology         [phone] ________________________________  Name: [name] MRN: [id number]  Date: 12/11/2017  DOB: 14-Sep-1941  SIMULATION AND TREATMENT PLANNING NOTE PUBIC ARCH STUDY  JS:EGBTD, [name], MD  Rana Snare, MD  DIAGNOSIS: 77 y.o. gentleman with stage T1c adenocarcinoma of the prostate with a Gleason's score of 3+4 and a PSA of 10.2     ICD-10-CM   1. Prostate cancer ([place]) C61     COMPLEX SIMULATION:  The patient presented today for evaluation for possible prostate seed implant. He was brought to the radiation planning suite and placed supine on the CT couch. A 3-dimensional image study set was obtained in upload to the planning computer. There, on each axial slice, I contoured the prostate gland. Then, using three-dimensional radiation planning tools I reconstructed the prostate in view of the structures from the transperineal needle pathway to assess for possible pubic arch interference. In doing so, I did not appreciate any pubic arch interference. Also, the patient's prostate volume was estimated based on the drawn structure. The volume was 56 cc.  Given the pubic arch appearance and prostate volume, patient remains a good candidate to proceed with prostate seed implant. Today, he freely provided informed written consent to proceed.    PLAN: The patient will undergo prostate seed implant.   ________________________________  Sheral Apley. Tammi Klippel, M.D.  This document serves as a record of services personally performed by Tyler Pita, MD. It was created on his behalf by Rae Lips, a trained medical scribe. The creation of this record is based on the scribe's personal observations and the provider's statements to them. This document has been checked and approved by the attending provider.

## 2017-12-17 ENCOUNTER — Telehealth: Payer: Self-pay | Admitting: *Deleted

## 2017-12-17 NOTE — Telephone Encounter (Signed)
CALLED PATIENT TO INFORM OF CHEST X-RAY AND EKG FOR 12-18-17 @ 10 AM, SPOKE WITH PATIENT AND HE IS AWARE OF THESE APPTS.

## 2017-12-17 NOTE — Telephone Encounter (Signed)
CALLED PATIENT TO INFORM OF IMPLANT DATE, LVM FOR A RETURN CALL 

## 2017-12-18 ENCOUNTER — Encounter (HOSPITAL_COMMUNITY)
Admission: RE | Admit: 2017-12-18 | Discharge: 2017-12-18 | Disposition: A | Discharge status: Home / Self Care | Payer: BC Managed Care – PPO | Source: Ambulatory Visit | Attending: Radiation Oncology | Admitting: Radiation Oncology

## 2017-12-18 ENCOUNTER — Ambulatory Visit (HOSPITAL_COMMUNITY)
Admission: RE | Admit: 2017-12-18 | Discharge: 2017-12-18 | Disposition: A | Discharge status: Home / Self Care | Payer: BC Managed Care – PPO | Source: Ambulatory Visit | Attending: Urology | Admitting: Urology

## 2017-12-18 DIAGNOSIS — Z01810 Encounter for preprocedural cardiovascular examination: Secondary | ICD-10-CM | POA: Insufficient documentation

## 2017-12-19 ENCOUNTER — Other Ambulatory Visit: Payer: Self-pay | Admitting: Urology

## 2017-12-20 ENCOUNTER — Other Ambulatory Visit: Payer: Self-pay | Admitting: Urology

## 2017-12-20 DIAGNOSIS — C61 Malignant neoplasm of prostate: Secondary | ICD-10-CM

## 2018-01-15 ENCOUNTER — Encounter (HOSPITAL_BASED_OUTPATIENT_CLINIC_OR_DEPARTMENT_OTHER): Payer: Self-pay | Admitting: *Deleted

## 2018-01-16 ENCOUNTER — Encounter (HOSPITAL_BASED_OUTPATIENT_CLINIC_OR_DEPARTMENT_OTHER): Payer: Self-pay | Admitting: *Deleted

## 2018-01-16 ENCOUNTER — Other Ambulatory Visit: Payer: Self-pay

## 2018-01-16 ENCOUNTER — Telehealth: Payer: Self-pay | Admitting: *Deleted

## 2018-01-16 NOTE — Telephone Encounter (Signed)
CALLED PATIENT TO REMIND OF LABS FOR Monday April 22 - ARRIVAL TIME - 10:45 AM @ [facility] ADMITTING, LVM FOR A RETURN CALL

## 2018-01-16 NOTE — Progress Notes (Signed)
SPOKE W/ PT VIA PHONE FOR PRE-OP INTERVIEW.  NPO AFTER MN W/ EXCEPTION CLEAR LIQUIDS UNTIL 0700 (NO CREAM/MILK PRODUCTS).  ARRIVE AT 1100.  GETTING LAB WORK DONE Monday 01-20-2018 @ 1100 (CBC,CMET,PT/INR,PTT).  CURRENT CXR AND EKG IN CHART AND Epic.  WILL DO FLEET AM DOS.

## 2018-01-20 ENCOUNTER — Encounter (HOSPITAL_COMMUNITY)
Admission: RE | Admit: 2018-01-20 | Discharge: 2018-01-20 | Disposition: A | Discharge status: Home / Self Care | Payer: BC Managed Care – PPO | Source: Ambulatory Visit | Attending: Urology | Admitting: Urology

## 2018-01-20 DIAGNOSIS — Z01812 Encounter for preprocedural laboratory examination: Secondary | ICD-10-CM | POA: Diagnosis present

## 2018-01-20 LAB — COMPREHENSIVE METABOLIC PANEL
ALBUMIN: 4.1 g/dL (ref 3.5–5.0)
ALK PHOS: 63 U/L (ref 38–126)
ALT: 22 U/L (ref 17–63)
AST: 22 U/L (ref 15–41)
Anion gap: 9 (ref 5–15)
BUN: 20 mg/dL (ref 6–20)
CHLORIDE: 111 mmol/L (ref 101–111)
CO2: 22 mmol/L (ref 22–32)
CREATININE: 1.07 mg/dL (ref 0.61–1.24)
Calcium: 9.6 mg/dL (ref 8.9–10.3)
GFR calc Af Amer: >60 mL/min (ref >60)
GFR calc non Af Amer: >60 mL/min (ref >60)
GLUCOSE: 108 mg/dL — AB (ref 65–99)
Potassium: 4.5 mmol/L (ref 3.5–5.1)
SODIUM: 142 mmol/L (ref 135–145)
Total Bilirubin: 0.8 mg/dL (ref 0.3–1.2)
Total Protein: 6.9 g/dL (ref 6.5–8.1)

## 2018-01-20 LAB — CBC
HCT: 43.5 % (ref 39.0–52.0)
HEMOGLOBIN: 15.4 g/dL (ref 13.0–17.0)
MCH: 32.5 pg (ref 26.0–34.0)
MCHC: 35.4 g/dL (ref 30.0–36.0)
MCV: 91.8 fL (ref 78.0–100.0)
Platelets: 167 10*3/uL (ref 150–400)
RBC: 4.74 MIL/uL (ref 4.22–5.81)
RDW: 13 % (ref 11.5–15.5)
WBC: 5.8 10*3/uL (ref 4.0–10.5)

## 2018-01-20 LAB — APTT: APTT: 33 s (ref 24–36)

## 2018-01-20 LAB — PROTIME-INR
INR: 1.03
Prothrombin Time: 13.4 seconds (ref 11.4–15.2)

## 2018-01-23 ENCOUNTER — Telehealth: Payer: Self-pay | Admitting: *Deleted

## 2018-01-23 NOTE — H&P (Signed)
Urology Preoperative H&P   Chief Complaint: prostate cancer  History of Present Illness: [name] is a 76 y.o. male with  a history of T1c, Gleason 3+4 prostate cancer diagnosed in Feb. 2017. He is currently being followed by Dr. Manning and is scheduled to undergo brachytherapy seed and Space OAR placement on 01/24/18.   Last PSA- 11.3 (11/2017)-- His PSA was 7.6 at the time of diagnosis and has been progressively rising.   From a urinary standpoint, he reports good force of stream and feels like he is emptying his bladder well. He does report occasional urinary urgency/frequency and nocturia x1, but is not overly bothered by these symptoms. He denies interval urinary tract infections, dysuria or hematuria. He is not currently on any BPH medications.   AUASS-9 SHIM-12    Past Medical History:  Diagnosis Date  . ED (erectile dysfunction)   . Glaucoma, both eyes   . History of chronic prostatitis   . Hyperplasia of prostate with lower urinary tract symptoms (LUTS)   . 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 Primary hypogonadism in male   . Prostate cancer (HCC) urologist-  dr winter/  oncologist-  dr manning   dx 11-21-2015 via bx , Stage T1c, gleason 3+4, PSA 10.4 -- active survillance--- now scheduled for radioactive seed implants 01-24-2018    Past Surgical History:  Procedure Laterality Date  . CATARACT EXTRACTION W/ INTRAOCULAR LENS  IMPLANT, BILATERAL  2014  approx.  . HERNIA REPAIR  age 6  . PLACEMENT PORT-A-CATH AND REMOVAL  08-22-2007/  REMOVAL 06-22-2010  . PROSTATE BIOPSY  11-21-2015    dr grapey office  . TRANSTHORACIC ECHOCARDIOGRAM  08/20/2007   EF 60-65%/  MILD LAE/  MILD MR/  TRIVIAL PR AND TR    Allergies:  Allergies  Allergen  Reactions  . Amoxicillin Rash  . Ampicillin Rash    Family History  Problem Relation Age of Onset  . Cancer Brother        multiple myeloma  . Glaucoma Brother   . Diabetes Mother     Social History:  reports that he quit smoking about 21 years ago. His smoking use included cigars. He quit after 20.00 years of use. He has never used smokeless tobacco. He reports that he drinks about 0.6 oz of alcohol per week. He reports that he does not use drugs.  ROS: A complete review of systems was performed.  All systems are negative except for pertinent findings as noted.  Physical Exam:  Vital signs in last 24 hours:   Constitutional:  Alert and oriented, No acute distress Cardiovascular: Regular rate and rhythm, No JVD Respiratory: Normal respiratory effort, Lungs clear bilaterally GI: Abdomen is soft, nontender, nondistended, no abdominal masses GU: No CVA tenderness Lymphatic: No lymphadenopathy Neurologic: Grossly intact, no focal deficits Psychiatric: Normal mood and affect  Laboratory Data:  No results for input(s): WBC, HGB, HCT, PLT in the last 72 hours.  No results for input(s): NA, K, CL, GLUCOSE, BUN, CALCIUM, CREATININE in the last 72 hours.  Invalid input(s): CO3   No results found for this or any previous visit (from the past 24 hour(s)). No results found for this or any previous visit (from the past 240 hour(s)).  Renal Function: Recent Labs      01/20/18 1120  CREATININE 1.07   Estimated Creatinine Clearance: 62.5 mL/min (by C-G formula based on SCr of 1.07 mg/dL).  Radiologic Imaging: No results found.  I independently reviewed the above imaging studies.  Assessment and Plan [name] is a 76 y.o. male with T1c, Gleason 3+4 prostate cancer diagnosed in Feb. 2017  -Will proceed with brachytherapy and Space OAR placement with Dr. Manning. The risks, benefits and alternatives of both procedures was discussed at length with the patient. Risks include  bleeding, urinary tract infection, urinary retention, rectal injury, malposition of brachytherapy seeds, MI, stroke, PE and the inherent risks of general anesthesia.  He voices understanding and wishes to proceed.    Christopher Winter, MD 01/23/2018, 4:24 PM  [facility] Pager: [phone]  

## 2018-01-23 NOTE — Telephone Encounter (Signed)
Called patient to remind of procedure for 01-24-18, lvm for a return call

## 2018-01-24 ENCOUNTER — Ambulatory Visit: Payer: Self-pay | Admitting: Radiation Oncology

## 2018-01-24 ENCOUNTER — Ambulatory Visit (HOSPITAL_BASED_OUTPATIENT_CLINIC_OR_DEPARTMENT_OTHER): Payer: BC Managed Care – PPO | Admitting: Anesthesiology

## 2018-01-24 ENCOUNTER — Encounter (HOSPITAL_BASED_OUTPATIENT_CLINIC_OR_DEPARTMENT_OTHER)
Admission: RE | Disposition: A | Discharge status: Home / Self Care | Payer: Self-pay | Source: Ambulatory Visit | Attending: Urology

## 2018-01-24 ENCOUNTER — Encounter (HOSPITAL_BASED_OUTPATIENT_CLINIC_OR_DEPARTMENT_OTHER): Payer: Self-pay

## 2018-01-24 ENCOUNTER — Ambulatory Visit (HOSPITAL_BASED_OUTPATIENT_CLINIC_OR_DEPARTMENT_OTHER)
Admission: RE | Admit: 2018-01-24 | Discharge: 2018-01-24 | Disposition: A | Discharge status: Home / Self Care | Payer: BC Managed Care – PPO | Source: Ambulatory Visit | Attending: Urology | Admitting: Urology

## 2018-01-24 ENCOUNTER — Ambulatory Visit (HOSPITAL_COMMUNITY): Payer: BC Managed Care – PPO

## 2018-01-24 DIAGNOSIS — Z87891 Personal history of nicotine dependence: Secondary | ICD-10-CM | POA: Diagnosis not present

## 2018-01-24 DIAGNOSIS — Z8572 Personal history of non-Hodgkin lymphomas: Secondary | ICD-10-CM | POA: Diagnosis not present

## 2018-01-24 DIAGNOSIS — Z880 Allergy status to penicillin: Secondary | ICD-10-CM | POA: Diagnosis not present

## 2018-01-24 DIAGNOSIS — Z9842 Cataract extraction status, left eye: Secondary | ICD-10-CM | POA: Insufficient documentation

## 2018-01-24 DIAGNOSIS — Z9841 Cataract extraction status, right eye: Secondary | ICD-10-CM | POA: Diagnosis not present

## 2018-01-24 DIAGNOSIS — Z807 Family history of other malignant neoplasms of lymphoid, hematopoietic and related tissues: Secondary | ICD-10-CM | POA: Insufficient documentation

## 2018-01-24 DIAGNOSIS — Z9221 Personal history of antineoplastic chemotherapy: Secondary | ICD-10-CM | POA: Insufficient documentation

## 2018-01-24 DIAGNOSIS — H409 Unspecified glaucoma: Secondary | ICD-10-CM | POA: Diagnosis not present

## 2018-01-24 DIAGNOSIS — C61 Malignant neoplasm of prostate: Secondary | ICD-10-CM | POA: Diagnosis not present

## 2018-01-24 DIAGNOSIS — Z01818 Encounter for other preprocedural examination: Secondary | ICD-10-CM

## 2018-01-24 HISTORY — PX: CYSTOSCOPY: SHX5120

## 2018-01-24 HISTORY — DX: Non-Hodgkin lymphoma, unspecified, unspecified site: C85.90

## 2018-01-24 HISTORY — PX: RADIOACTIVE SEED IMPLANT: SHX5150

## 2018-01-24 HISTORY — DX: Benign prostatic hyperplasia with lower urinary tract symptoms: N40.1

## 2018-01-24 HISTORY — DX: Personal history of other diseases of male genital organs: Z87.438

## 2018-01-24 HISTORY — DX: Testicular hypofunction: E29.1

## 2018-01-24 HISTORY — DX: Unspecified glaucoma: H40.9

## 2018-01-24 HISTORY — PX: SPACE OAR INSTILLATION: SHX6769

## 2018-01-24 HISTORY — DX: Male erectile dysfunction, unspecified: N52.9

## 2018-01-24 SURGERY — INSERTION, RADIATION SOURCE, PROSTATE
Anesthesia: General

## 2018-01-24 MED ORDER — ONDANSETRON HCL 4 MG/2ML IJ SOLN
INTRAMUSCULAR | Status: DC | PRN
  Administered 2018-01-24: 4 mg via INTRAVENOUS

## 2018-01-24 MED ORDER — STERILE WATER FOR IRRIGATION IR SOLN
Status: DC | PRN
  Administered 2018-01-24: 500 mL

## 2018-01-24 MED ORDER — MEPERIDINE HCL 25 MG/ML IJ SOLN
6.2500 mg | INTRAMUSCULAR | Status: DC | PRN
  Filled 2018-01-24: qty 1

## 2018-01-24 MED ORDER — MIDAZOLAM HCL 2 MG/2ML IJ SOLN
INTRAMUSCULAR | Status: AC
  Filled 2018-01-24: qty 2

## 2018-01-24 MED ORDER — DEXAMETHASONE SODIUM PHOSPHATE 10 MG/ML IJ SOLN
INTRAMUSCULAR | Status: AC
  Filled 2018-01-24: qty 1

## 2018-01-24 MED ORDER — TRAMADOL HCL 50 MG PO TABS: 50.0000 mg | ORAL_TABLET | Freq: Four times a day (QID) | ORAL | 0 refills | Status: AC | PRN

## 2018-01-24 MED ORDER — LIDOCAINE 2% (20 MG/ML) 5 ML SYRINGE
INTRAMUSCULAR | Status: AC
  Filled 2018-01-24: qty 5

## 2018-01-24 MED ORDER — FENTANYL CITRATE (PF) 100 MCG/2ML IJ SOLN
INTRAMUSCULAR | Status: DC | PRN
  Administered 2018-01-24 (×2): 25 ug via INTRAVENOUS
  Administered 2018-01-24: 50 ug via INTRAVENOUS

## 2018-01-24 MED ORDER — DEXAMETHASONE SODIUM PHOSPHATE 10 MG/ML IJ SOLN
INTRAMUSCULAR | Status: DC | PRN
  Administered 2018-01-24: 10 mg via INTRAVENOUS

## 2018-01-24 MED ORDER — PROPOFOL 10 MG/ML IV BOLUS
INTRAVENOUS | Status: DC | PRN
  Administered 2018-01-24: 120 mg via INTRAVENOUS

## 2018-01-24 MED ORDER — CIPROFLOXACIN IN D5W 400 MG/200ML IV SOLN
400.0000 mg | INTRAVENOUS | Status: AC
  Administered 2018-01-24: 400 mg via INTRAVENOUS
  Filled 2018-01-24: qty 200

## 2018-01-24 MED ORDER — ONDANSETRON HCL 4 MG PO TABS: 4.0000 mg | ORAL_TABLET | Freq: Every day | ORAL | 1 refills | Status: DC | PRN

## 2018-01-24 MED ORDER — FENTANYL CITRATE (PF) 100 MCG/2ML IJ SOLN
25.0000 ug | INTRAMUSCULAR | Status: DC | PRN
  Filled 2018-01-24: qty 1

## 2018-01-24 MED ORDER — LACTATED RINGERS IV SOLN
INTRAVENOUS | Status: DC
  Administered 2018-01-24 (×2): via INTRAVENOUS
  Filled 2018-01-24: qty 1000

## 2018-01-24 MED ORDER — IOHEXOL 300 MG/ML  SOLN
INTRAMUSCULAR | Status: DC | PRN
  Administered 2018-01-24: 7 mL

## 2018-01-24 MED ORDER — FENTANYL CITRATE (PF) 100 MCG/2ML IJ SOLN
INTRAMUSCULAR | Status: AC
  Filled 2018-01-24: qty 2

## 2018-01-24 MED ORDER — SODIUM CHLORIDE 0.9 % IV SOLN
INTRAVENOUS | Status: AC | PRN
  Administered 2018-01-24: 1000 mL

## 2018-01-24 MED ORDER — PROPOFOL 10 MG/ML IV BOLUS
INTRAVENOUS | Status: AC
  Filled 2018-01-24: qty 20

## 2018-01-24 MED ORDER — LIDOCAINE 2% (20 MG/ML) 5 ML SYRINGE
INTRAMUSCULAR | Status: DC | PRN
  Administered 2018-01-24: 60 mg via INTRAVENOUS

## 2018-01-24 MED ORDER — SODIUM CHLORIDE 0.9 % IJ SOLN
INTRAMUSCULAR | Status: DC | PRN
  Administered 2018-01-24: 10 mL

## 2018-01-24 MED ORDER — MIDAZOLAM HCL 5 MG/5ML IJ SOLN
INTRAMUSCULAR | Status: DC | PRN
  Administered 2018-01-24: .51 mg via INTRAVENOUS

## 2018-01-24 MED ORDER — CIPROFLOXACIN IN D5W 400 MG/200ML IV SOLN
INTRAVENOUS | Status: AC
  Filled 2018-01-24: qty 200

## 2018-01-24 MED ORDER — EPHEDRINE SULFATE-NACL 50-0.9 MG/10ML-% IV SOSY
PREFILLED_SYRINGE | INTRAVENOUS | Status: DC | PRN
  Administered 2018-01-24 (×2): 8 mg via INTRAVENOUS

## 2018-01-24 MED ORDER — ONDANSETRON HCL 4 MG/2ML IJ SOLN
INTRAMUSCULAR | Status: AC
  Filled 2018-01-24: qty 2

## 2018-01-24 MED ORDER — FLEET ENEMA 7-19 GM/118ML RE ENEM
1.0000 | ENEMA | Freq: Once | RECTAL | Status: DC
  Filled 2018-01-24: qty 1

## 2018-01-24 SURGICAL SUPPLY — 28 items
BAG URINE DRAINAGE (UROLOGICAL SUPPLIES) ×3 IMPLANT
BLADE CLIPPER SURG (BLADE) ×3 IMPLANT
CATH FOLEY 2WAY SLVR  5CC 16FR (CATHETERS) ×1
CATH FOLEY 2WAY SLVR 5CC 16FR (CATHETERS) ×2 IMPLANT
CATH ROBINSON RED A/P 16FR (CATHETERS) IMPLANT
CATH ROBINSON RED A/P 20FR (CATHETERS) ×3 IMPLANT
CLOTH BEACON ORANGE TIMEOUT ST (SAFETY) ×3 IMPLANT
COVER BACK TABLE 60X90IN (DRAPES) ×3 IMPLANT
COVER MAYO STAND STRL (DRAPES) ×3 IMPLANT
DRSG TEGADERM 4X4.75 (GAUZE/BANDAGES/DRESSINGS) ×6 IMPLANT
DRSG TEGADERM 8X12 (GAUZE/BANDAGES/DRESSINGS) ×3 IMPLANT
GAUZE SPONGE 4X4 12PLY STRL (GAUZE/BANDAGES/DRESSINGS) ×3 IMPLANT
GLOVE BIO SURGEON STRL SZ7.5 (GLOVE) ×3 IMPLANT
GLOVE ECLIPSE 8.0 STRL XLNG CF (GLOVE) ×9 IMPLANT
GOWN STRL REUS W/TWL LRG LVL3 (GOWN DISPOSABLE) ×6 IMPLANT
GOWN STRL REUS W/TWL XL LVL3 (GOWN DISPOSABLE) ×3 IMPLANT
HOLDER FOLEY CATH W/STRAP (MISCELLANEOUS) IMPLANT
IMPL SPACEOAR SYSTEM 10ML (MISCELLANEOUS) ×2 IMPLANT
IMPLANT SPACEOAR SYSTEM 10ML (MISCELLANEOUS) ×3
IV SOD CHL 0.9% 1000ML (IV SOLUTION) ×3 IMPLANT
KIT TURNOVER CYSTO (KITS) ×3 IMPLANT
PACK CYSTO (CUSTOM PROCEDURE TRAY) ×3 IMPLANT
SURGILUBE 2OZ TUBE FLIPTOP (MISCELLANEOUS) ×3 IMPLANT
SUT BONE WAX W31G (SUTURE) ×3 IMPLANT
SYRINGE 10CC LL (SYRINGE) ×3 IMPLANT
UNDERPAD 30X30 (UNDERPADS AND DIAPERS) ×6 IMPLANT
WATER STERILE IRR 500ML POUR (IV SOLUTION) ×3 IMPLANT
radioactive seed ×210 IMPLANT

## 2018-01-24 NOTE — Anesthesia Preprocedure Evaluation (Addendum)
Anesthesia Evaluation  Patient identified by MRN, date of birth, ID band Patient awake    Reviewed: Allergy & Precautions, H&P , NPO status , Patient's Chart, lab work & pertinent test results, reviewed documented beta blocker date and time   Airway Mallampati: III  TM Distance: >3 FB Neck ROM: full  Mouth opening: Limited Mouth Opening  Dental no notable dental hx. 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  Diffuse Large cell B-Cell, High Grade, CD20(+), Stage II (dx from large abdominal  mass biospy,  bone marrow 08-26-2007 negative/  completed chemotherapy 11-05-2007 with complete remission    Anesthesia Other Findings   Reproductive/Obstetrics negative OB ROS                            Anesthesia Physical Anesthesia Plan  ASA: III  Anesthesia Plan: General   Post-op Pain Management:    Induction: Intravenous  PONV Risk Score and Plan: 2 and Ondansetron and Treatment may vary due to age or medical condition  Airway Management Planned: LMA and Oral ETT  Additional Equipment:   Intra-op Plan:   Post-operative Plan:   Informed Consent: I have reviewed the patients History and Physical, chart, labs and discussed the procedure including the risks, benefits and alternatives for the proposed anesthesia with the patient or authorized representative who has indicated his/her understanding and  acceptance.   Dental Advisory Given  Plan Discussed with: CRNA, Anesthesiologist and Surgeon  Anesthesia Plan Comments: ( )       Anesthesia Quick Evaluation

## 2018-01-24 NOTE — Discharge Instructions (Signed)

## 2018-01-24 NOTE — Progress Notes (Signed)
1645 correct time

## 2018-01-24 NOTE — Anesthesia Postprocedure Evaluation (Signed)
Anesthesia Post Note  Patient: [name]  Procedure(s) Performed: RADIOACTIVE SEED IMPLANT/BRACHYTHERAPY IMPLANT (N/A ) SPACE OAR INSTILLATION (N/A ) CYSTOSCOPY     Patient location during evaluation: PACU Anesthesia Type: General Level of consciousness: awake and alert Pain management: pain level controlled Vital Signs Assessment: post-procedure vital signs reviewed and stable Respiratory status: spontaneous breathing, nonlabored ventilation and respiratory function stable Cardiovascular status: blood pressure returned to baseline and stable Postop Assessment: no apparent nausea or vomiting Anesthetic complications: no    Last Vitals:  Vitals:   01/24/18 1515 01/24/18 1530  BP: 130/75 129/78  Pulse: 65 65  Resp: 16 17  Temp:    SpO2: 93% 94%    Last Pain:  Vitals:   01/24/18 1515  TempSrc:   PainSc: Asleep                 Catalina Gravel

## 2018-01-24 NOTE — Transfer of Care (Signed)
Immediate Anesthesia Transfer of Care Note  Patient: [name]  Procedure(s) Performed: RADIOACTIVE SEED IMPLANT/BRACHYTHERAPY IMPLANT (N/A ) SPACE OAR INSTILLATION (N/A ) CYSTOSCOPY  Patient Location: PACU  Anesthesia Type:General  Level of Consciousness: sedated  Airway & Oxygen Therapy: Patient Spontanous Breathing and Patient connected to nasal cannula oxygen  Post-op Assessment: Report given to RN and Post -op Vital signs reviewed and stable  Post vital signs: Reviewed and stable  Last Vitals:  Vitals Value Taken Time  BP 106/62 01/24/2018  2:49 PM  Temp    Pulse 67 01/24/2018  2:49 PM  Resp 14 01/24/2018  2:49 PM  SpO2 93 % 01/24/2018  2:49 PM  Vitals shown include unvalidated device data.  Last Pain:  Vitals:   01/24/18 1100  TempSrc: Oral      Patients Stated Pain Goal: 6 (09/40/76 8088)  Complications: No apparent anesthesia complications

## 2018-01-24 NOTE — Progress Notes (Signed)
  Radiation Oncology         [phone]) [phone] ________________________________  Name: [name] MRN: [id number]  Date: 01/24/2018  DOB: 11-08-1940       Prostate Seed Implant  GO:TLXBW, [name], MD  No ref. provider found  DIAGNOSIS: 77 y.o. gentleman with stage T1c adenocarcinoma of the prostate with a Gleason's score of 3+4 and a PSA of 10.2    ICD-10-CM   1. Pre-op testing Z01.818 DG Chest 2 View    DG Chest 2 View  2. Prostate cancer ([place]) C61 DG Chest 2 View    DG Chest 2 View    PROCEDURE: Insertion of radioactive I-125 seeds into the prostate gland.  RADIATION DOSE: 145 Gy, definitive therapy.  TECHNIQUE: CACE OSORTO was brought to the operating room with the urologist. He was placed in the dorsolithotomy position. He was catheterized and a rectal tube was inserted. The perineum was shaved, prepped and draped. The ultrasound probe was then introduced into the rectum to see the prostate gland.  TREATMENT DEVICE: A needle grid was attached to the ultrasound probe stand and anchor needles were placed.  3D PLANNING: The prostate was imaged in 3D using a sagittal sweep of the prostate probe. These images were transferred to the planning computer. There, the prostate, urethra and rectum were defined on each axial reconstructed image. Then, the software created an optimized 3D plan and a few seed positions were adjusted. The quality of the plan was reviewed using [facility] information for the target and the following two organs at risk:  Urethra and Rectum.  Then the accepted plan was uploaded to the seed Selectron afterloading unit.  PROSTATE VOLUME STUDY:  Using transrectal ultrasound the volume of the prostate was verified to be 56 cc.  SPECIAL TREATMENT PROCEDURE/SUPERVISION AND HANDLING: The Nucletron FIRST system was used to place the needles under sagittal guidance. A total of 20 needles were used to deposit 70 seeds in the prostate gland. The individual seed activity was 0.585  mCi.  SpaceOAR:  Yes  COMPLEX SIMULATION: At the end of the procedure, an anterior radiograph of the pelvis was obtained to document seed positioning and count. Cystoscopy was performed to check the urethra and bladder.  MICRODOSIMETRY: At the end of the procedure, the patient was emitting 0.1 mR/hr at 1 meter. Accordingly, he was considered safe for hospital discharge.  PLAN: The patient will return to the radiation oncology clinic for post implant CT dosimetry in three weeks.   ________________________________  Sheral Apley Tammi Klippel, M.D.

## 2018-01-24 NOTE — Anesthesia Procedure Notes (Signed)
Procedure Name: LMA Insertion Date/Time: 01/24/2018 1:16 PM Performed by: Suan Halter, CRNA Pre-anesthesia Checklist: Patient identified, Emergency Drugs available, Suction available and Patient being monitored Patient Re-evaluated:Patient Re-evaluated prior to induction Oxygen Delivery Method: Circle system utilized Preoxygenation: Pre-oxygenation with 100% oxygen Induction Type: IV induction Ventilation: Mask ventilation without difficulty LMA: LMA inserted LMA Size: 5.0 Number of attempts: 1 Airway Equipment and Method: Bite block Placement Confirmation: positive ETCO2 Tube secured with: Tape Dental Injury: Teeth and Oropharynx as per pre-operative assessment

## 2018-01-24 NOTE — Op Note (Signed)
PATIENT:  [name]  PRE-OPERATIVE DIAGNOSIS:  Adenocarcinoma of the prostate  POST-OPERATIVE DIAGNOSIS:  Same  PROCEDURE:  1. I-125 radioactive seed implantation 2. Cystoscopy  3. Placement of SpaceOAR  SURGEON:  Ellison Hughs, MD  Radiation oncologist: Tyler Pita, MD  ANESTHESIA:  General  EBL:  Minimal  DRAINS: None  INDICATION: [name]  Description of procedure: After informed consent the patient was brought to the major OR, placed on the table and administered general anesthesia. He was then moved to the modified lithotomy position with his perineum perpendicular to the floor. His perineum and genitalia were then sterilely prepped. An official timeout was then performed. A 16 French Foley catheter was then placed in the bladder and filled with dilute contrast, a rectal tube was placed in the rectum and the transrectal ultrasound probe was placed in the rectum and affixed to the stand. He was then sterilely draped.  Real time ultrasonography was used along with the seed planning software Oncentra Prostate vs. 4.2.2.4. This was used to develop the seed plan including the number of needles as well as number of seeds required for complete and adequate coverage. Real-time ultrasonography was then used along with the previously developed plan and the Nucletron device to implant a total of 70 seeds using 20 needles. This proceeded without difficulty or complication.   I then proceeded with placement of SpaceOAR by introducing a needle with the bevel angled inferiorly approximately 2 cm superior to the anus. This was angled downward and under direct ultrasound was placed within the space between the prostatic capsule and rectum. This was confirmed with a small amount of sterile saline injected and this was performed under direct ultrasound. I then attached the SpaceOAR to the needle and injected this in the space between the prostate and rectum with good placement  noted.  A Foley catheter was then removed as well as the transrectal ultrasound probe and rectal probe. Flexible cystoscopy was then performed using the 17 French flexible scope which revealed a normal urethra throughout its length down to the sphincter which appeared intact. The prostatic urethra revealed bilobar hypertrophy but no evidence of obstruction, seeds, spacers or lesions. The bladder was then entered and fully and systematically inspected. The ureteral orifices were noted to be of normal configuration and position. The mucosa revealed no evidence of tumors. There were also no stones identified within the bladder. I noted no seeds or spacers on the floor of the bladder and retroflexion of the scope revealed no seeds protruding from the base of the prostate.  The cystoscope was then removed and the patient was awakened and taken to recovery room in stable and satisfactory condition. He tolerated procedure well and there were no intraoperative complications.

## 2018-01-27 ENCOUNTER — Encounter (HOSPITAL_BASED_OUTPATIENT_CLINIC_OR_DEPARTMENT_OTHER): Payer: Self-pay | Admitting: Urology

## 2018-01-29 ENCOUNTER — Telehealth: Payer: Self-pay | Admitting: Urology

## 2018-01-29 NOTE — Telephone Encounter (Signed)
I attempted to return the patient's call but had to [facility] requesting that he call back if he still has questions or concerns.  Also indicated that he is welcome to ask for Aldona Bar, RN if I am unavailable at the time that he calls back as she will very likely be able to answer his questions and/or direct him his specific questions back to me if needed. -Alithia Zavaleta

## 2018-02-05 ENCOUNTER — Telehealth: Payer: Self-pay | Admitting: *Deleted

## 2018-02-05 NOTE — Telephone Encounter (Signed)
Returned patient's phone call, lvm for a return call 

## 2018-02-06 ENCOUNTER — Telehealth: Payer: Self-pay | Admitting: *Deleted

## 2018-02-06 NOTE — Telephone Encounter (Signed)
CALLED PATIENT TO REMIND OF POST SEED APPTS. AND MRI FOR 02-07-18, LVM FOR A RETURN CALL

## 2018-02-07 ENCOUNTER — Ambulatory Visit
Admission: RE | Admit: 2018-02-07 | Discharge: 2018-02-07 | Disposition: A | Discharge status: Home / Self Care | Payer: BC Managed Care – PPO | Source: Ambulatory Visit | Attending: Radiation Oncology | Admitting: Radiation Oncology

## 2018-02-07 ENCOUNTER — Ambulatory Visit (HOSPITAL_COMMUNITY)
Admission: RE | Admit: 2018-02-07 | Discharge: 2018-02-07 | Disposition: A | Discharge status: Home / Self Care | Payer: BC Managed Care – PPO | Source: Ambulatory Visit | Attending: Urology | Admitting: Urology

## 2018-02-07 DIAGNOSIS — Z510 Encounter for antineoplastic radiation therapy: Secondary | ICD-10-CM | POA: Diagnosis not present

## 2018-02-07 DIAGNOSIS — Z79899 Other long term (current) drug therapy: Secondary | ICD-10-CM | POA: Insufficient documentation

## 2018-02-07 DIAGNOSIS — R3911 Hesitancy of micturition: Secondary | ICD-10-CM | POA: Diagnosis not present

## 2018-02-07 DIAGNOSIS — C61 Malignant neoplasm of prostate: Secondary | ICD-10-CM

## 2018-02-07 DIAGNOSIS — Z923 Personal history of irradiation: Secondary | ICD-10-CM | POA: Insufficient documentation

## 2018-02-07 NOTE — Progress Notes (Signed)
Weight and vitals stable. Denies pain. An IPSS of 7 was noted both pre seed and post seed. Reports dysuria s/p implant has resolved. Reports hematuria s/p implant has resolved. Denies any urinary leakage or incontinence. Patient reports he is leaving town next week and doesn't plan to return until August. Patient does not presently have a follow up appointment set with urologist. MRI to confirm SpaceOar placement scheduled for 3 pm today.   BP 121/74   Pulse 70   Temp 99.3 F (37.4 C) (Oral)   Resp 16   Wt 200 lb 3.2 oz (90.8 kg)   SpO2 96%   BMI 27.15 kg/m  Wt Readings from Last 3 Encounters:  02/07/18 200 lb 3.2 oz (90.8 kg)  01/24/18 198 lb 11.2 oz (90.1 kg)  08/29/17 204 lb 12.8 oz (92.9 kg)

## 2018-02-07 NOTE — Progress Notes (Signed)
  Radiation Oncology         [phone] ________________________________  Name: [name] MRN: [id number]  Date: 02/07/2018  DOB: Oct 21, 1940  COMPLEX SIMULATION NOTE  NARRATIVE:  The patient was brought to the [facility] today following prostate seed implantation approximately one month ago.  Identity was confirmed.  All relevant records and images related to the planned course of therapy were reviewed.  Then, the patient was set-up supine.  CT images were obtained.  The CT images were loaded into the planning software.  Then the prostate and rectum were contoured.  Treatment planning then occurred.  The implanted iodine 125 seeds were identified by the physics staff for projection of radiation distribution  I have requested : 3D Simulation  I have requested a DVH of the following structures: Prostate and rectum.    ________________________________  Sheral Apley Tammi Klippel, M.D.  This document serves as a record of services personally performed by Tyler Pita, MD. It was created on his behalf by Arlyce Harman, a trained medical scribe. The creation of this record is based on the scribe's personal observations and the provider's statements to them. This document has been checked and approved by the attending provider.

## 2018-02-07 NOTE — Progress Notes (Signed)
Radiation Oncology         [phone] ________________________________  Name: [name] MRN: [id number]  Date: 02/07/2018  DOB: [birth year]  Follow-Up Visit Note  CC: Carol Ada, MD  Rana Snare, MD  Diagnosis:   77 y.o. 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  Interval Since Last Radiation:  2 weeks  01/24/2018: Insertion of radioactive I-125 seeds into the prostate gland; 145 Gy, definitive therapy with placement of SpaceOAR gel.  Narrative:  The patient returns today for routine follow-up.  He is complaining of increased urinary frequency and urinary hesitation symptoms. He filled out a questionnaire regarding urinary function today providing and overall IPSS score of 7 characterizing his symptoms as mild.  His pre-implant score was 7.  He has mild residual increased urgency and nocturia x2/night.  He specifically denies gross hematuria, dysuria, excessive daytime frequency, weak stream, straining to void or incomplete emptying.  He denies any bowel symptoms.  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  He intends to follow up with Dr. Lovena Neighbours on return in August.  ALLERGIES:  is allergic to amoxicillin and ampicillin.  Meds: Current Outpatient Medications  Medication Sig Dispense Refill  . Cholecalciferol (VITAMIN D3) 1000 units CAPS Take 2 capsules by mouth daily.    Marland Kitchen latanoprost (XALATAN) 0.005 % ophthalmic solution Place 1 drop into both eyes at bedtime.     . Multiple Vitamin (MULTIVITAMIN) capsule Take 1 capsule by mouth daily.    . ondansetron (ZOFRAN) 4 MG tablet Take 1 tablet (4 mg total) by mouth daily as needed for nausea or vomiting. 30 tablet 1  . vitamin C (ASCORBIC ACID) 500 MG tablet Take 1,500 mg by mouth daily.    . Zinc Sulfate 220 (50 ZN) MG TABS Take 1 tablet by mouth daily.      No current facility-administered medications for this encounter.     Physical Findings: The patient is in no acute distress. Patient is alert and oriented.  vitals were not taken for this visit. .  No significant changes.  Lab Findings: Lab Results  Component Value Date   WBC 5.8 01/20/2018   HGB 15.4 01/20/2018   HCT 43.5 01/20/2018   MCV 91.8 01/20/2018   PLT 167 01/20/2018    Radiographic Findings:  Patient underwent CT imaging in our clinic for post implant dosimetry. Dr. Tammi Klippel personally reviewed these images and the CT appears to demonstrate an adequate distribution of radioactive seeds throughout the prostate gland. There are no seeds in or near the rectum. He will have a prostate MRI later today and these images will be fused with CT images for further evaluation. We suspect the final radiation plan and dosimetry will show appropriate coverage of the prostate gland.   Impression: The patient is recovering from the effects of radiation. His urinary symptoms should gradually improve over the next 4-6 months. We talked about this today. He is encouraged by his improvement already and is otherwise pleased with his outcome.   Plan: Today, we spent time talking to the patient about his prostate seed implant and resolving urinary symptoms. We also talked about long-term follow-up for prostate cancer following seed implant. He understands that ongoing PSA determinations and digital rectal exams will help perform surveillance to rule out disease recurrence. He plans to follow  up with Dr. Lovena Neighbours in August, when he returns from sabbatical in [place].  He understands what to expect with his PSA measures. Patient was also educated today about some of the long-term effects from radiation including a small risk for rectal bleeding and possibly erectile dysfunction. We talked about some of the general management approaches to these potential complications. However, I did encourage the patient  to contact our office or return at any point if he has questions or concerns related to his previous radiation and prostate cancer.    Nicholos Johns, PA-C    Tyler Pita, MD  [facility] Direct Dial: [phone]  Fax: [phone] [email or web address]  Skype  [email or web address]  This document serves as a record of services personally performed by Tyler Pita, MD and Freeman Caldron, PA-C. It was created on their behalf by Arlyce Harman, a trained medical scribe. The creation of this record is based on the scribe's personal observations and the provider's statements to them. This document has been checked and approved by the attending provider.

## 2018-02-11 ENCOUNTER — Encounter: Payer: Self-pay | Admitting: Medical Oncology

## 2018-02-17 ENCOUNTER — Telehealth: Payer: Self-pay | Admitting: Medical Oncology

## 2018-02-17 NOTE — Telephone Encounter (Signed)
[name] called to thank me for my call last week. He states he is doing well post brachytherapy. He is headed to  to [place] for the summer to enjoy some cajun dancing.

## 2018-02-21 ENCOUNTER — Encounter: Payer: Self-pay | Admitting: Radiation Oncology

## 2018-02-21 DIAGNOSIS — C61 Malignant neoplasm of prostate: Secondary | ICD-10-CM | POA: Diagnosis not present

## 2018-02-24 NOTE — Progress Notes (Signed)
  Radiation Oncology         [phone] ________________________________  Name: [name] MRN: [id number]  Date: 02/21/2018  DOB: [birth year]  3D Planning Note   Prostate Brachytherapy Post-Implant Dosimetry  Diagnosis: 77 y.o. gentleman with stage T1c adenocarcinoma of the prostate with a Gleason's score of 3+4 and a PSA of 10.2   Narrative: On a previous date, [name] returned following prostate seed implantation for post implant planning. He underwent CT scan complex simulation to delineate the three-dimensional structures of the pelvis and demonstrate the radiation distribution.  Since that time, the seed localization, and complex isodose planning with dose volume histograms have now been completed.  Results:   Prostate Coverage - The dose of radiation delivered to the 90% or more of the prostate gland (D90) was 91.39% of the prescription dose. This exceeds our goal of greater than 90%. Rectal Sparing - The volume of rectal tissue receiving the prescription dose or higher was 0.0 cc. This falls under our thresholds tolerance of 1.0 cc.  Impression: The prostate seed implant appears to show adequate target coverage and appropriate rectal sparing.  Plan:  The patient will continue to follow with urology for ongoing PSA determinations. I would anticipate a high likelihood for local tumor control with minimal risk for rectal morbidity.  ________________________________  Sheral Apley Tammi Klippel, M.D.

## 2018-08-04 ENCOUNTER — Telehealth: Payer: Self-pay | Admitting: Hematology

## 2018-08-04 NOTE — Telephone Encounter (Signed)
Lacie PAL 11/29 - moved appoinments to 12/2. Left message and mailed schedule.

## 2018-08-29 ENCOUNTER — Ambulatory Visit: Payer: BC Managed Care – PPO | Admitting: Nurse Practitioner

## 2018-08-29 ENCOUNTER — Other Ambulatory Visit: Payer: BC Managed Care – PPO

## 2018-08-31 NOTE — Progress Notes (Signed)
[facility]  Telephone:(336) [phone] Fax:(336) [phone]  Clinic Follow up Note   Patient Care Team: Carol Ada, MD as PCP - General (Family Medicine) 09/01/2018  SUMMARY OF ONCOLOGIC HISTORY:   Lymphoma in remission ([place])   08/13/2007 Cancer Diagnosis    Large cell B-cell non-Hodgkin's lymphoma, high grade, CD20(+), diagnosed from a large abdominal mass biopsy. Bone marrow on 08/26/2007 was negative. The patient was felt to have stage II disease.     11/05/2007 -  Chemotherapy    6 cycles of Rituxan CHOD from 08/27/2007 through 11/05/2007 with the achievement of a complete remission.    01/31/2012 Initial Diagnosis    Lymphoma in remission    CURRENT THERAPY: surveillance   INTERVAL HISTORY: [name] returns for annual f/u as scheduled. He was treated with seed implant for prostate cancer per Dr. Tammi Klippel in 12/2017 which went well. He follows up with urology, next apt in 11/2018. He denies other changes in his health in the interim. He denies fever, unintentional weight loss, night sweats, lymphadenopathy, or n/v/c/d. It was brought to his attention on a date that he occasionally "chokes" or coughs a lot after chewing large bites of food. He does not notice this when he eats alone. He denies GERD, dysphagia or odynophagia. He has occasional DOE after walking up 3 flights of stairs to his office, otherwise denies cough, chest pain. 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    MEDICAL HISTORY:  Past Medical History:  Diagnosis Date  . ED (erectile dysfunction)   . Glaucoma, both eyes   . History of chronic prostatitis   . Hyperplasia of prostate with lower urinary tract symptoms (LUTS)   . Non-Hodgkin lymphoma in remission [facility]) oncologist-  dr Truitt Merle   dx 08-13-2007, Diffuse Large cell B-Cell, High Grade, CD20(+), Stage II (dx from large abdominal  mass biospy,  bone marrow 08-26-2007 negative/   completed chemotherapy 11-05-2007 with complete remission  . Primary hypogonadism in male   . Prostate cancer [facility]) urologist-  dr winter/  oncologist-  dr Tammi Klippel   dx 11-21-2015 via bx , Stage T1c, gleason 3+4, PSA 10.4 -- active survillance--- now scheduled for radioactive seed implants 01-24-2018    SURGICAL HISTORY: Past Surgical History:  Procedure Laterality Date  . CATARACT EXTRACTION W/ INTRAOCULAR LENS  IMPLANT, BILATERAL  2014  approx.  . CYSTOSCOPY  01/24/2018   Procedure: CYSTOSCOPY;  Surgeon: Ceasar Mons, MD;  Location: [facility];  Service: Urology;;  no seeds noted in bladder  . HERNIA REPAIR  age 76  . PLACEMENT PORT-A-CATH AND REMOVAL  08-22-2007/  REMOVAL 06-22-2010  . PROSTATE BIOPSY  11-21-2015    dr Risa Grill office  . RADIOACTIVE SEED IMPLANT N/A 01/24/2018   Procedure: RADIOACTIVE SEED IMPLANT/BRACHYTHERAPY IMPLANT;  Surgeon: Ceasar Mons, MD;  Location: [facility];  Service: Urology;  Laterality: N/A;   seeds implanted  . SPACE OAR INSTILLATION N/A 01/24/2018   Procedure: SPACE OAR INSTILLATION;  Surgeon: Ceasar Mons, MD;  Location: [facility];  Service: Urology;  Laterality: N/A;  . TRANSTHORACIC ECHOCARDIOGRAM  08/20/2007   EF 60-65%/  MILD LAE/  MILD MR/  TRIVIAL PR AND TR    I have reviewed the social history and family history with the patient and they are unchanged from previous note.  ALLERGIES:  is allergic to amoxicillin and ampicillin.  MEDICATIONS:  Current Outpatient Medications  Medication Sig Dispense Refill  . Cholecalciferol (VITAMIN D3) 1000 units CAPS Take 2 capsules by mouth daily.    Marland Kitchen latanoprost (XALATAN) 0.005 % ophthalmic solution Place 1 drop into both eyes at bedtime.     . Multiple Vitamin (MULTIVITAMIN) capsule Take 1 capsule by mouth daily.    . vitamin C (ASCORBIC ACID) 500 MG tablet Take 1,500 mg by mouth daily.    . Zinc Sulfate 220 (50 ZN) MG  TABS Take 1 tablet by mouth daily.     No current facility-administered medications for this visit.     PHYSICAL EXAMINATION: ECOG PERFORMANCE STATUS: 0 - Asymptomatic  Vitals:   09/01/18 1144  BP: 136/89  Pulse: 63  Resp: 17  Temp: 98.2 F (36.8 C)  SpO2: 97%   Filed Weights   09/01/18 1144  Weight: 201 lb 8 oz (91.4 kg)    GENERAL:alert, no distress and comfortable SKIN: no rashes or significant lesions EYES: sclera clear OROPHARYNX:no thrush or ulcers  LYMPH:  no palpable cervical, supraclavicular, axillary, or inguinal lymphadenopathy LUNGS: clear to auscultation with normal breathing effort HEART: regular rate & rhythm, no lower extremity edema ABDOMEN:abdomen soft, non-tender and normal bowel sounds NEURO: alert & oriented x 3 with fluent speech  LABORATORY DATA:  I have reviewed the data as listed CBC Latest Ref Rng & Units 09/01/2018 01/20/2018 08/29/2017  WBC 4.0 - 10.5 K/uL 5.7 5.8 6.2  Hemoglobin 13.0 - 17.0 g/dL 14.6 15.4 14.8  Hematocrit 39.0 - 52.0 % 42.7 43.5 42.1  Platelets 150 - 400 K/uL 168 167 171     CMP Latest Ref Rng & Units 09/01/2018 01/20/2018 08/29/2017  Glucose 70 - 99 mg/dL 91 108(H) 106  BUN 8 - 23 mg/dL 20 20 16.8  Creatinine 0.61 - 1.24 mg/dL 1.17 1.07 1.0  Sodium 135 - 145 mmol/L 145 142 142  Potassium 3.5 - 5.1 mmol/L 4.5 4.5 4.1  Chloride 98 - 111 mmol/L 109 111 -  CO2 22 - 32 mmol/L 27 22 24   Calcium 8.9 - 10.3 mg/dL 9.5 9.6 9.1  Total Protein 6.5 - 8.1 g/dL 6.9 6.9 6.7  Total Bilirubin 0.3 - 1.2 mg/dL 0.7 0.8 0.69  Alkaline Phos 38 - 126 U/L 81 63 69  AST 15 - 41 U/L 21 22 24   ALT 0 - 44 U/L 18 22 23       RADIOGRAPHIC STUDIES: I have personally reviewed the radiological images as listed and agreed with the findings in the report. No results found.   ASSESSMENT & PLAN: 77 yo male   1. Diffuse large B cell Lymphoma, stage II,  in remission -[name] is clinically doing well. He is asymptomatic, physical exam is  unremarkable. Labs reviewed. No clinical concern for recurrence.  -we reviewed signs and symptoms indicating possible recurrence.  -He prefers to f/u annually with [place] -lab, f/u in 1 year   2. Prostate cancer -due to his elevated PSA, he underwent prostate biopsy, which showed prostate cancer -s/p radiation seed implant per Dr. Tammi Klippel 12/2017  -He will follow-up with Dr. Risa Grill in 11/2018  3. Cancer screening  -He has not had screening colonoscopy, I encouraged him to consider colonoscopy, or stool test for colon cancer screening. -He has occasional coughing after eating, no dysphagia. I encouraged him to f/u with PCP and/or possible GI referral. He cannot remember his last colonoscopy, never had upper GI work up.  -I encouraged him to eat well, hydrate, and increase physical exercise  for general health maintenance. He agrees.   PLAN: -Labs reviewed, no concerns  -lab, f/u with Dr. Burr Medico in 1 year  -Health maintenance practices   All questions were answered. The patient knows to call the clinic with any problems, questions or concerns. No barriers to learning was detected. I spent 20 minutes counseling the patient face to face. The total time spent in the appointment was 25 minutes and more than 50% was on counseling and review of test results     Alla Feeling, NP 09/01/18

## 2018-09-01 ENCOUNTER — Inpatient Hospital Stay: Payer: BC Managed Care – PPO | Attending: Nurse Practitioner

## 2018-09-01 ENCOUNTER — Inpatient Hospital Stay (HOSPITAL_BASED_OUTPATIENT_CLINIC_OR_DEPARTMENT_OTHER): Payer: BC Managed Care – PPO | Admitting: Nurse Practitioner

## 2018-09-01 ENCOUNTER — Telehealth: Payer: Self-pay

## 2018-09-01 ENCOUNTER — Telehealth: Payer: Self-pay | Admitting: Emergency Medicine

## 2018-09-01 ENCOUNTER — Encounter: Payer: Self-pay | Admitting: Nurse Practitioner

## 2018-09-01 VITALS — BP 136/89 | HR 63 | Temp 98.2°F | Resp 17 | Ht 72.0 in | Wt 201.5 lb

## 2018-09-01 DIAGNOSIS — Z79899 Other long term (current) drug therapy: Secondary | ICD-10-CM | POA: Insufficient documentation

## 2018-09-01 DIAGNOSIS — G629 Polyneuropathy, unspecified: Secondary | ICD-10-CM | POA: Diagnosis not present

## 2018-09-01 DIAGNOSIS — C8590 Non-Hodgkin lymphoma, unspecified, unspecified site: Secondary | ICD-10-CM

## 2018-09-01 DIAGNOSIS — C8330 Diffuse large B-cell lymphoma, unspecified site: Secondary | ICD-10-CM | POA: Diagnosis present

## 2018-09-01 DIAGNOSIS — C61 Malignant neoplasm of prostate: Secondary | ICD-10-CM | POA: Insufficient documentation

## 2018-09-01 LAB — CMP (CANCER CENTER ONLY)
ALBUMIN: 4 g/dL (ref 3.5–5.0)
ALT: 18 U/L (ref 0–44)
AST: 21 U/L (ref 15–41)
Alkaline Phosphatase: 81 U/L (ref 38–126)
Anion gap: 9 (ref 5–15)
BILIRUBIN TOTAL: 0.7 mg/dL (ref 0.3–1.2)
BUN: 20 mg/dL (ref 8–23)
CO2: 27 mmol/L (ref 22–32)
Calcium: 9.5 mg/dL (ref 8.9–10.3)
Chloride: 109 mmol/L (ref 98–111)
Creatinine: 1.17 mg/dL (ref 0.61–1.24)
GFR, Est AFR Am: >60 mL/min (ref >60)
GFR, Estimated: 59 mL/min — ABNORMAL LOW (ref >60)
GLUCOSE: 91 mg/dL (ref 70–99)
POTASSIUM: 4.5 mmol/L (ref 3.5–5.1)
Sodium: 145 mmol/L (ref 135–145)
TOTAL PROTEIN: 6.9 g/dL (ref 6.5–8.1)

## 2018-09-01 LAB — CBC WITH DIFFERENTIAL/PLATELET
ABS IMMATURE GRANULOCYTES: 0.01 10*3/uL (ref 0.00–0.07)
BASOS ABS: 0.1 10*3/uL (ref 0.0–0.1)
BASOS PCT: 1 %
EOS ABS: 0.4 10*3/uL (ref 0.0–0.5)
Eosinophils Relative: 6 %
HEMATOCRIT: 42.7 % (ref 39.0–52.0)
HEMOGLOBIN: 14.6 g/dL (ref 13.0–17.0)
IMMATURE GRANULOCYTES: 0 %
Lymphocytes Relative: 17 %
Lymphs Abs: 1 10*3/uL (ref 0.7–4.0)
MCH: 31.3 pg (ref 26.0–34.0)
MCHC: 34.2 g/dL (ref 30.0–36.0)
MCV: 91.6 fL (ref 80.0–100.0)
Monocytes Absolute: 0.8 10*3/uL (ref 0.1–1.0)
Monocytes Relative: 13 %
NEUTROS PCT: 63 %
NRBC: 0 % (ref 0.0–0.2)
Neutro Abs: 3.6 10*3/uL (ref 1.7–7.7)
Platelets: 168 10*3/uL (ref 150–400)
RBC: 4.66 MIL/uL (ref 4.22–5.81)
RDW: 12.9 % (ref 11.5–15.5)
WBC: 5.7 10*3/uL (ref 4.0–10.5)

## 2018-09-01 LAB — LACTATE DEHYDROGENASE: LDH: 182 U/L (ref 98–192)

## 2018-09-01 NOTE — Telephone Encounter (Addendum)
VM left for patient to call back regarding these results.   ----- Message from Alla Feeling, NP sent at 09/01/2018  1:07 PM EST ----- Please let him know LDH is normal. No concerns. F/u in 1 year as we discussed.   Thanks, Regan Rakers

## 2018-09-01 NOTE — Telephone Encounter (Signed)
Printed avs and calander of upcoming appointment. Per 12/2 los

## 2019-08-25 ENCOUNTER — Telehealth: Payer: Self-pay | Admitting: Hematology

## 2019-08-25 NOTE — Telephone Encounter (Signed)
Called patient regarding providers request, left a voicemail.

## 2019-08-25 NOTE — Telephone Encounter (Signed)
Patient called regarding voicemail that was left, patient agreed to do phone visit on 11/30.

## 2019-08-31 ENCOUNTER — Telehealth: Payer: Self-pay | Admitting: Hematology

## 2019-08-31 ENCOUNTER — Inpatient Hospital Stay: Payer: BC Managed Care – PPO

## 2019-08-31 ENCOUNTER — Inpatient Hospital Stay: Payer: BC Managed Care – PPO | Admitting: Hematology

## 2019-08-31 NOTE — Telephone Encounter (Signed)
Returned patient's phone call regarding rescheduling 11/30 appointment, per patient's request appointment has moved to 02/08.

## 2019-11-06 ENCOUNTER — Telehealth: Payer: Self-pay | Admitting: Hematology

## 2019-11-06 ENCOUNTER — Telehealth: Payer: Self-pay

## 2019-11-06 NOTE — Telephone Encounter (Signed)
I left a message with patient regarding schedule °

## 2019-11-06 NOTE — Telephone Encounter (Signed)
High priority scheduling message sent to reschedule pt for early April per Dr. Burr Medico

## 2019-11-09 ENCOUNTER — Other Ambulatory Visit: Payer: BC Managed Care – PPO

## 2019-11-09 ENCOUNTER — Ambulatory Visit: Payer: BC Managed Care – PPO | Admitting: Hematology

## 2020-01-07 ENCOUNTER — Ambulatory Visit: Payer: BC Managed Care – PPO | Admitting: Hematology

## 2020-01-07 ENCOUNTER — Other Ambulatory Visit: Payer: BC Managed Care – PPO

## 2021-03-22 ENCOUNTER — Other Ambulatory Visit (HOSPITAL_COMMUNITY): Payer: Self-pay | Admitting: Urology

## 2021-03-22 DIAGNOSIS — R9721 Rising PSA following treatment for malignant neoplasm of prostate: Secondary | ICD-10-CM

## 2021-04-06 ENCOUNTER — Other Ambulatory Visit: Payer: Self-pay

## 2021-04-06 ENCOUNTER — Ambulatory Visit (HOSPITAL_COMMUNITY)
Admission: RE | Admit: 2021-04-06 | Discharge: 2021-04-06 | Disposition: A | Discharge status: Home / Self Care | Payer: Medicare PPO | Source: Ambulatory Visit | Attending: Urology | Admitting: Urology

## 2021-04-06 DIAGNOSIS — R9721 Rising PSA following treatment for malignant neoplasm of prostate: Secondary | ICD-10-CM | POA: Insufficient documentation

## 2021-04-06 MED ORDER — PIFLIFOLASTAT F 18 (PYLARIFY) INJECTION
9.0000 | Freq: Once | INTRAVENOUS | Status: AC
  Administered 2021-04-06: 9.49 via INTRAVENOUS

## 2021-09-04 IMAGING — CT NM PET TUM IMG SKULL BASE T - THIGH
1 of 7 series · 1 of 25 positions shown · non-contrast
Comparison: None.

CLINICAL DATA: Prostate carcinoma with biochemical recurrence.

EXAM:
NUCLEAR MEDICINE PET SKULL BASE TO THIGH
TECHNIQUE: 9.49 mCi F18 Piflufolastat (Pylarify) was injected intravenously.
Full-ring PET imaging was performed from the skull base to thigh
after the radiotracer. CT data was obtained and used for attenuation
correction and anatomic localization.

[Series 3: pet sk_thigh ac · axial · 5.0mm · 4.07mm/px · 1 of 257 slices shown]
[im 154/257]
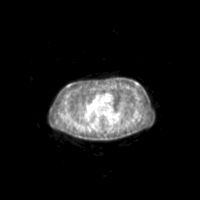

[1 of 25 positions shown; findings below may reference images not displayed]

FINDINGS: NECK

No radiotracer activity in neck lymph nodes.

Incidental CT finding: None

CHEST

There several very small radiotracer avid prevascular nodes. A
cluster of nodes measures 2-3 mm each on image 88 with SUV max equal
6.6. Radiotracer activity associated with a very small
supraclavicular nodal LEFT with SUV max equal 3.2 measuring 5 mm
image [DATE])

Incidental CT finding: No suspicious pulmonary nodules

ABDOMEN/PELVIS

Prostate: Focal activity within the LEFT aspect of the prostate
gland with SUV max equal 7.8. This is approximately 1 cm lesion.
Lesion within the field of brachytherapy seeds.

Lymph nodes: Several hypermetabolic foci along the LEFT common iliac
nodal station. One of the larger small nodes measures 5 mm (171/CT
series 4) with SUV max equal 8.9. Several other nodes are too small
to measure.

Additional intensely radiotracer avid nodes in the periaortic
retroperitoneum. For example 7 mm node LEFT aorta on image 161 with
SUV max equal

Node position between the aorta and IVC the level the kidneys
measures 6 mm (image 152) with SUV max equal 6.5.

Liver: No evidence of liver metastasis

Incidental CT finding: None

SKELETON

Single focus of intense radiotracer activity associated with
posterior medial aspect of the RIGHT iliac bone with SUV max equal
10.3 on image 187. There is a small sclerotic lesion adjacent to the
SI joint which corresponds the activity (6 mm lesion on image 186).
IMPRESSION: 1. Focus of activity in the LEFT prostate gland is concerning for
prostate carcinoma recurrence.
2. Intense radiotracer activity associated with LEFT common iliac
lymph nodes consistent with local nodal metastasis.
3. Small hypermetabolic nodes in the periaortic retroperitoneum most
consistent with retroperitoneal nodal metastasis.
4. Intense radiotracer activity small prevascular lymph nodes and
LEFT supraclavicular node noted are less specific but also
concerning for metastatic disease to the mediastinum/thorax.
5. Concern for SOLITARY SKELETAL METASTASIS with intense radiotracer
activity associated with the small sclerotic lesion in the posterior
RIGHT iliac bone.

## 2022-01-19 ENCOUNTER — Telehealth: Payer: Self-pay | Admitting: Genetic Counselor

## 2022-01-19 NOTE — Telephone Encounter (Signed)
Scheduled appt per 4/20 referral. Pt is aware of appt date and time. Pt is aware to arrive 15 mins prior to appt time and to bring and updated insurance card. Pt is aware of appt location.   ?

## 2022-03-19 ENCOUNTER — Other Ambulatory Visit: Payer: Self-pay | Admitting: Genetic Counselor

## 2022-03-19 DIAGNOSIS — C61 Malignant neoplasm of prostate: Secondary | ICD-10-CM

## 2022-03-21 ENCOUNTER — Inpatient Hospital Stay: Payer: Medicare PPO | Attending: Genetic Counselor | Admitting: Genetic Counselor

## 2022-03-21 ENCOUNTER — Inpatient Hospital Stay: Payer: Medicare PPO

## 2022-03-21 ENCOUNTER — Other Ambulatory Visit: Payer: Self-pay

## 2022-03-21 DIAGNOSIS — C61 Malignant neoplasm of prostate: Secondary | ICD-10-CM

## 2022-03-21 DIAGNOSIS — C8590 Non-Hodgkin lymphoma, unspecified, unspecified site: Secondary | ICD-10-CM | POA: Diagnosis not present

## 2022-03-21 DIAGNOSIS — Z800 Family history of malignant neoplasm of digestive organs: Secondary | ICD-10-CM | POA: Diagnosis not present

## 2022-03-21 LAB — GENETIC SCREENING ORDER

## 2022-03-22 ENCOUNTER — Encounter: Payer: Self-pay | Admitting: Genetic Counselor

## 2022-03-22 DIAGNOSIS — Z800 Family history of malignant neoplasm of digestive organs: Secondary | ICD-10-CM | POA: Insufficient documentation

## 2022-03-22 NOTE — Progress Notes (Signed)
REFERRING PROVIDER: Festus Aloe, MD [place],  [place] [address]  PRIMARY PROVIDER:  Carol Ada, MD  PRIMARY REASON FOR VISIT:  1. Lymphoma in remission ([place])   2. Prostate cancer ([place])   3. Family history of colon cancer      HISTORY OF PRESENT ILLNESS:   [name], a 81 y.o. male, was seen for a [place] cancer genetics consultation at the request of Dr. Junious Silk due to a personal and family history of cancer.  [name] presents to clinic today to discuss the possibility of a hereditary predisposition to cancer, genetic testing, and to further clarify his future cancer risks, as well as potential cancer risks for family members.   In 2008, at the age of 52, [name] was diagnosed with Lymphoma.  This is now in remission.  In 2015, at the age of 44, [name] was diagnosed with prostate cancer.  He was treated with radioactive seeds.  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       CANCER HISTORY:  Oncology History  Lymphoma in remission ([place])  08/13/2007 Cancer Diagnosis   Large cell B-cell non-Hodgkin's lymphoma, high grade, CD20(+), diagnosed from a large abdominal mass biopsy. Bone marrow  on 08/26/2007 was negative. The patient was felt to have stage II  disease.    11/05/2007 -  Chemotherapy   6 cycles of  Rituxan CHOD from 08/27/2007 through 11/05/2007 with the achievement of a complete remission.   01/31/2012 Initial Diagnosis   Lymphoma in remission     Past Medical History:  Diagnosis Date   ED (erectile dysfunction)    Glaucoma, both eyes    History of chronic prostatitis    Hyperplasia of prostate with lower urinary tract symptoms (LUTS)    Non-Hodgkin lymphoma in remission [facility]) oncologist-  dr Truitt Merle   dx 08-13-2007, Diffuse Large cell B-Cell, High Grade, CD20(+), Stage II (dx from large abdominal  mass biospy,  bone marrow 08-26-2007 negative/  completed chemotherapy 11-05-2007 with complete remission    Primary hypogonadism in male    Prostate cancer [facility]) urologist-  dr winter/  oncologist-  dr Tammi Klippel   dx 11-21-2015 via bx , Stage T1c, gleason 3+4, PSA 10.4 -- active survillance--- now scheduled for radioactive seed implants 01-24-2018    Past Surgical History:  Procedure Laterality Date   CATARACT EXTRACTION W/ INTRAOCULAR LENS  IMPLANT, BILATERAL  2014  approx.   CYSTOSCOPY  01/24/2018   Procedure: CYSTOSCOPY;  Surgeon: Ceasar Mons, MD;  Location: [facility];  Service: Urology;;  no seeds noted in bladder   HERNIA REPAIR  age 5   [place]  08-22-2007/  REMOVAL 06-22-2010   PROSTATE BIOPSY  11-21-2015    dr Risa Grill office   RADIOACTIVE SEED IMPLANT N/A 01/24/2018   Procedure: RADIOACTIVE SEED IMPLANT/BRACHYTHERAPY IMPLANT;  Surgeon: Ceasar Mons, MD;  Location: [facility];  Service: Urology;  Laterality: N/A;   seeds implanted   SPACE OAR INSTILLATION N/A 01/24/2018   Procedure: SPACE OAR INSTILLATION;  Surgeon: Ceasar Mons, MD;  Location: [facility];  Service: Urology;  Laterality: N/A;   TRANSTHORACIC ECHOCARDIOGRAM  08/20/2007   EF 60-65%/  MILD LAE/  MILD MR/  TRIVIAL PR AND TR    Social History   Socioeconomic History   Marital status: Divorced    Spouse name: Not on file   Number of children: Not on file  Years of education: Not on file   Highest education level: Not on file  Occupational History   Occupation: Professor    Employer: [employer]    Comment: biology  Tobacco Use   Smoking status: Former    Types: Cigars    Quit date: 01/16/1997    Years since quitting: 25.1   Smokeless tobacco: Never  Vaping Use   Vaping Use: Never used  Substance and Sexual Activity   Alcohol use: Yes    Alcohol/week: 1.0 standard drink of alcohol    Types: 1 Glasses of wine per week   Drug use: No   Sexual activity: Not on file  Other Topics Concern   Not on file   Social History Narrative   Enjoyed Cajun music and dancing   Social Determinants of Health   Financial Resource Strain: Not on file  Food Insecurity: Not on file  Transportation Needs: Not on file  Physical Activity: Not on file  Stress: Not on file  Social Connections: Not on file     FAMILY HISTORY:  We obtained a detailed, 4-generation family history.  Significant diagnoses are listed below: Family History  Problem Relation Age of Onset   Diabetes Mother    Heart disease Mother    Glaucoma Brother    Multiple myeloma Brother 66   Diabetes Maternal Grandfather    Heart disease Paternal Grandfather    Colon cancer Cousin 74    The patient does not have children. He has one brother who died of multiple myeloma at 56.  Both parents are deceased.  The patient's mother died of heart disease.  She had one sister and three brothers.  None had cancer but one brother has a son who had colon cancer at 46.  The maternal grandparents are deceased from non-cancer related issues.  The patient's father died at 66.  He had one full sister and three paternal half brothers and a half sister.  None were reported to have cancer.  The paternal grandfather died of heart disease.  The grandmother died of unknown causes in [place]  [name] is unaware of previous family history of genetic testing for hereditary cancer risks. Patient's maternal ancestors are of [place], [place], [place] and [place] descent, and paternal ancestors are of [place] descent. There is no reported Ashkenazi Jewish ancestry. There is no known consanguinity.  GENETIC COUNSELING ASSESSMENT: Mr. Ayotte is a 81 y.o. male with a personal history of metastatic prostate cancer which is somewhat suggestive of a hereditary predisposition to cancer given the metastatic nature of the disease. We, therefore, discussed and recommended the following at today's visit.   DISCUSSION: We discussed that, in general, most cancer is not inherited  in families, but instead is sporadic or familial. Sporadic cancers occur by chance and typically happen at older ages (>50 years) as this type of cancer is caused by genetic changes acquired during an individual's lifetime. Some families have more cancers than would be expected by chance; however, the ages or types of cancer are not consistent with a known genetic mutation or known genetic mutations have been ruled out. This type of familial cancer is thought to be due to a combination of multiple genetic, environmental, hormonal, and lifestyle factors. While this combination of factors likely increases the risk of cancer, the exact source of this risk is not currently identifiable or testable.  We discussed that up to 15% of prostate cancer is hereditary, with most cases associated with BRCA mutations.  There  are other genes that can be associated with hereditary prostate cancer syndromes.  These include HOXB13, other DNA repair genes such as ATM, CHEK2 and PALB2, as well as Lynch syndrome mutations.  We discussed that testing is beneficial for several reasons including knowing how to follow individuals after completing their treatment, identifying whether potential treatment options such as PARP inhibitors would be beneficial, and understand if other family members could be at risk for cancer and allow them to undergo genetic testing.   We reviewed the characteristics, features and inheritance patterns of hereditary cancer syndromes. We also discussed genetic testing, including the appropriate family members to test, the process of testing, insurance coverage and turn-around-time for results. We discussed the implications of a negative, positive, carrier and/or variant of uncertain significant result. We recommended Mr. Perkey pursue genetic testing for the CancerNext-Expanded+RNAinsight gene panel.   The CancerNext-Expanded gene panel offered by [facility] and includes sequencing and rearrangement  analysis for the following 77 genes: AIP, ALK, APC*, ATM*, AXIN2, BAP1, BARD1, BLM, BMPR1A, BRCA1*, BRCA2*, BRIP1*, CDC73, CDH1*, CDK4, CDKN1B, CDKN2A, CHEK2*, CTNNA1, DICER1, FANCC, FH, FLCN, GALNT12, KIF1B, LZTR1, MAX, MEN1, MET, MLH1*, MSH2*, MSH3, MSH6*, MUTYH*, NBN, NF1*, NF2, NTHL1, PALB2*, PHOX2B, PMS2*, POT1, PRKAR1A, PTCH1, PTEN*, RAD51C*, RAD51D*, RB1, RECQL, RET, SDHA, SDHAF2, SDHB, SDHC, SDHD, SMAD4, SMARCA4, SMARCB1, SMARCE1, STK11, SUFU, TMEM127, TP53*, TSC1, TSC2, VHL and XRCC2 (sequencing and deletion/duplication); EGFR, EGLN1, HOXB13, KIT, MITF, PDGFRA, POLD1, and POLE (sequencing only); EPCAM and GREM1 (deletion/duplication only). DNA and RNA analyses performed for * genes.   Based on Mr. Coon personal history of cancer, he meets medical criteria for genetic testing. Despite that he meets criteria, he may still have an out of pocket cost. We discussed that if his out of pocket cost for testing is over $100, the laboratory will call and confirm whether he wants to proceed with testing.  If the out of pocket cost of testing is less than $100 he will be billed by the genetic testing laboratory.   PLAN: After considering the risks, benefits, and limitations, Mr. Maddocks provided informed consent to pursue genetic testing and the blood sample was sent to [facility] for analysis of the CancerNext-Expanded+RNAinsight. Results should be available within approximately 2-3 weeks' time, at which point they will be disclosed by telephone to Mr. Brister, as will any additional recommendations warranted by these results. Mr. Schlarb will receive a summary of his genetic counseling visit and a copy of his results once available. This information will also be available in Epic.   Lastly, we encouraged Mr. Kanouse to remain in contact with cancer genetics annually so that we can continuously update the family history and inform him of any changes in cancer genetics and testing that may be of  benefit for this family.   Mr. Mcglocklin questions were answered to his satisfaction today. Our contact information was provided should additional questions or concerns arise. Thank you for the referral and allowing [place] to share in the care of your patient.   Dorthy Magnussen P. Florene Glen, [place], [facility] Licensed, Insurance risk surveyor [email or web address] phone: [phone]  The patient was seen for a total of 35 minutes in face-to-face genetic counseling.  The patient was seen alone.  This patient was discussed with Drs. Magrinat, Lindi Adie and/or Burr Medico who agrees with the above.    _______________________________________________________________________ For Office Staff:  Number of people involved in session: 1 Was an Intern/ student involved with case: no

## 2022-04-06 ENCOUNTER — Encounter: Payer: Self-pay | Admitting: Genetic Counselor

## 2022-04-10 ENCOUNTER — Ambulatory Visit: Payer: Self-pay | Admitting: Genetic Counselor

## 2022-04-10 ENCOUNTER — Encounter: Payer: Self-pay | Admitting: Genetic Counselor

## 2022-04-10 ENCOUNTER — Telehealth: Payer: Self-pay | Admitting: Genetic Counselor

## 2022-04-10 DIAGNOSIS — C61 Malignant neoplasm of prostate: Secondary | ICD-10-CM

## 2022-04-10 DIAGNOSIS — Z1379 Encounter for other screening for genetic and chromosomal anomalies: Secondary | ICD-10-CM | POA: Insufficient documentation

## 2022-04-10 NOTE — Telephone Encounter (Signed)
Revealed negative genetic testing.  Discussed that we do not know why he has prostate cancer or why there is cancer in the family. It could be due to a different gene that we are not testing, or maybe our current technology may not be able to pick something up.  It will be important for him to keep in contact with genetics to keep up with whether additional testing may be needed.  A VUS was identified, but will not change medical management.

## 2022-04-10 NOTE — Progress Notes (Signed)
HPI:  [name] was previously seen in the [facility] clinic due to a personal and family history of cancer and concerns regarding a hereditary predisposition to cancer. Please refer to our prior cancer genetics clinic note for more information regarding our discussion, assessment and recommendations, at the time. [name] recent genetic test results were disclosed to him, as were recommendations warranted by these results. These results and recommendations are discussed in more detail below.  CANCER HISTORY:  Oncology History  Lymphoma in remission ([place])  08/13/2007 Cancer Diagnosis   Large cell B-cell non-Hodgkin's lymphoma, high grade, CD20(+), diagnosed from a large abdominal mass biopsy. Bone marrow  on 08/26/2007 was negative. The patient was felt to have stage II  disease.    11/05/2007 -  Chemotherapy   6 cycles of  Rituxan CHOD from 08/27/2007 through 11/05/2007 with the achievement of a complete remission.   01/31/2012 Initial Diagnosis   Lymphoma in remission   Prostate cancer ([place])  08/29/2017 Initial Diagnosis   Prostate cancer ([place])   04/06/2022 Genetic Testing   Negative genetic testing on the CancerNext-Expnded+RNAinsight panel.  KIF1B VUS identified.  The report date is April 06, 2022.  The CancerNext-Expanded gene panel offered by [facility] and includes sequencing and rearrangement analysis for the following 77 genes: AIP, ALK, APC*, ATM*, AXIN2, BAP1, BARD1, BLM, BMPR1A, BRCA1*, BRCA2*, BRIP1*, CDC73, CDH1*, CDK4, CDKN1B, CDKN2A, CHEK2*, CTNNA1, DICER1, FANCC, FH, FLCN, GALNT12, KIF1B, LZTR1, MAX, MEN1, MET, MLH1*, MSH2*, MSH3, MSH6*, MUTYH*, NBN, NF1*, NF2, NTHL1, PALB2*, PHOX2B, PMS2*, POT1, PRKAR1A, PTCH1, PTEN*, RAD51C*, RAD51D*, RB1, RECQL, RET, SDHA, SDHAF2, SDHB, SDHC, SDHD, SMAD4, SMARCA4, SMARCB1, SMARCE1, STK11, SUFU, TMEM127, TP53*, TSC1, TSC2, VHL and XRCC2 (sequencing and deletion/duplication); EGFR, EGLN1, HOXB13, KIT, MITF, PDGFRA, POLD1, and  POLE (sequencing only); EPCAM and GREM1 (deletion/duplication only). DNA and RNA analyses performed for * genes.      FAMILY HISTORY:  We obtained a detailed, 4-generation family history.  Significant diagnoses are listed below: Family History  Problem Relation Age of Onset   Diabetes Mother    Heart disease Mother    Glaucoma Brother    Multiple myeloma Brother 38   Diabetes Maternal Grandfather    Heart disease Paternal Grandfather    Colon cancer Cousin 10   Cancer Paternal Uncle        father's paternal half brother with gallbladder cancer     The patient does not have children. He has one brother who died of multiple myeloma at 55.  Both parents are deceased.   The patient's mother died of heart disease.  She had one sister and three brothers.  None had cancer but one brother has a son who had colon cancer at 59.  The maternal grandparents are deceased from non-cancer related issues.   The patient's father died at 49.  He had one full sister and three paternal half brothers and a half sister.  None were reported to have cancer.  The paternal grandfather died of heart disease.  The grandmother died of unknown causes in [place]   [name] is unaware of previous family history of genetic testing for hereditary cancer risks. Patient's maternal ancestors are of [place], [place], [place] and [place] descent, and paternal ancestors are of [place] descent. There is no reported Ashkenazi Jewish ancestry. There is no known consanguinity.  GENETIC TEST RESULTS: Genetic testing reported out on April 06, 2022 through the CancerNext-Expanded+RNAinsight cancer panel found no pathogenic mutations. The CancerNext-Expanded gene panel offered by [facility] and includes sequencing and rearrangement analysis for the following 77 genes: AIP, ALK, APC*, ATM*, AXIN2, BAP1, BARD1, BLM, BMPR1A, BRCA1*, BRCA2*, BRIP1*, CDC73, CDH1*, CDK4, CDKN1B, CDKN2A, CHEK2*, CTNNA1, DICER1, FANCC, FH, FLCN, GALNT12,  KIF1B, LZTR1, MAX, MEN1, MET, MLH1*, MSH2*, MSH3, MSH6*, MUTYH*, NBN, NF1*, NF2, NTHL1, PALB2*, PHOX2B, PMS2*, POT1, PRKAR1A, PTCH1, PTEN*, RAD51C*, RAD51D*, RB1, RECQL, RET, SDHA, SDHAF2, SDHB, SDHC, SDHD, SMAD4, SMARCA4, SMARCB1, SMARCE1, STK11, SUFU, TMEM127, TP53*, TSC1, TSC2, VHL and XRCC2 (sequencing and deletion/duplication); EGFR, EGLN1, HOXB13, KIT, MITF, PDGFRA, POLD1, and POLE (sequencing only); EPCAM and GREM1 (deletion/duplication only). DNA and RNA analyses performed for * genes. The test report has been scanned into EPIC and is located under the Molecular Pathology section of the Results Review tab.  A portion of the result report is included below for reference.     We discussed with [name] that because current genetic testing is not perfect, it is possible there may be a gene mutation in one of these genes that current testing cannot detect, but that chance is small.  We also discussed, that there could be another gene that has not yet been discovered, or that we have not yet tested, that is responsible for the cancer diagnoses in the family. It is also possible there is a hereditary cause for the cancer in the family that [name] did not inherit and therefore was not identified in his testing.  Therefore, it is important to remain in touch with cancer genetics in the future so that we can continue to offer [name] the most up to date genetic testing.   Genetic testing did identify a variant of uncertain significance (VUS) was identified in the KIF1B gene called [id number].  At this time, it is unknown if this variant is associated with increased cancer risk or if this is a normal finding, but most variants such as this get reclassified to being inconsequential. It should not be used to make medical management decisions. With time, we suspect the lab will determine the significance of this variant, if any. If we do learn more about it, we will try to contact [name] to discuss it  further. However, it is important to stay in touch with [place] periodically and keep the address and phone number up to date.  ADDITIONAL GENETIC TESTING: We discussed with [name] that his genetic testing was fairly extensive.  If there are genes identified to increase cancer risk that can be analyzed in the future, we would be happy to discuss and coordinate this testing at that time.    CANCER SCREENING RECOMMENDATIONS: [name] test result is considered negative (normal).  This means that we have not identified a hereditary cause for his personal and family history of cancer at this time. Most cancers happen by chance and this negative test suggests that his cancer may fall into this category.    While reassuring, this does not definitively rule out a hereditary predisposition to cancer. It is still possible that there could be genetic mutations that are undetectable by current technology. There could be genetic mutations in genes that have not been tested or identified to increase cancer risk.  Therefore, it is recommended he continue to follow the cancer management and screening guidelines provided by his oncology and primary healthcare provider.   An individual's cancer risk and medical management are not determined by genetic test results alone. Overall cancer risk assessment incorporates additional factors, including personal medical history, family history, and any available  genetic information that may result in a personalized plan for cancer prevention and surveillance  RECOMMENDATIONS FOR FAMILY MEMBERS:  Individuals in this family might be at some increased risk of developing cancer, over the general population risk, simply due to the family history of cancer.  We recommended women in this family have a yearly mammogram beginning at age 21, or 71 years younger than the earliest onset of cancer, an annual clinical breast exam, and perform monthly breast self-exams. Women in this family should  also have a gynecological exam as recommended by their primary provider. All family members should be referred for colonoscopy starting at age 61.  FOLLOW-UP: Lastly, we discussed with [name] that cancer genetics is a rapidly advancing field and it is possible that new genetic tests will be appropriate for him and/or his family members in the future. We encouraged him to remain in contact with cancer genetics on an annual basis so we can update his personal and family histories and let him know of advances in cancer genetics that may benefit this family.   Our contact number was provided. [name] questions were answered to his satisfaction, and he knows he is welcome to call us at anytime with additional questions or concerns.   Roma Kayser, [place], [facility] Licensed, Certified Genetic Counselor [email or web address]

## 2022-06-27 ENCOUNTER — Other Ambulatory Visit (HOSPITAL_COMMUNITY): Payer: Self-pay | Admitting: Urology

## 2022-06-27 DIAGNOSIS — C778 Secondary and unspecified malignant neoplasm of lymph nodes of multiple regions: Secondary | ICD-10-CM

## 2022-06-27 DIAGNOSIS — C7951 Secondary malignant neoplasm of bone: Secondary | ICD-10-CM

## 2022-06-27 DIAGNOSIS — C61 Malignant neoplasm of prostate: Secondary | ICD-10-CM

## 2022-07-25 ENCOUNTER — Encounter (HOSPITAL_COMMUNITY)
Admission: RE | Admit: 2022-07-25 | Discharge: 2022-07-25 | Disposition: A | Discharge status: Home / Self Care | Payer: Medicare PPO | Source: Ambulatory Visit | Attending: Urology | Admitting: Urology

## 2022-07-25 DIAGNOSIS — C778 Secondary and unspecified malignant neoplasm of lymph nodes of multiple regions: Secondary | ICD-10-CM | POA: Diagnosis present

## 2022-07-25 DIAGNOSIS — C7951 Secondary malignant neoplasm of bone: Secondary | ICD-10-CM | POA: Insufficient documentation

## 2022-07-25 DIAGNOSIS — C61 Malignant neoplasm of prostate: Secondary | ICD-10-CM | POA: Diagnosis present

## 2022-07-25 MED ORDER — PIFLIFOLASTAT F 18 (PYLARIFY) INJECTION
9.0000 | Freq: Once | INTRAVENOUS | Status: AC
  Administered 2022-07-25: 8.01 via INTRAVENOUS

## 2023-03-29 ENCOUNTER — Other Ambulatory Visit (HOSPITAL_COMMUNITY): Payer: Self-pay | Admitting: Urology

## 2023-03-29 DIAGNOSIS — C7951 Secondary malignant neoplasm of bone: Secondary | ICD-10-CM

## 2023-03-29 DIAGNOSIS — C61 Malignant neoplasm of prostate: Secondary | ICD-10-CM

## 2023-03-29 DIAGNOSIS — C778 Secondary and unspecified malignant neoplasm of lymph nodes of multiple regions: Secondary | ICD-10-CM

## 2023-04-18 ENCOUNTER — Encounter (HOSPITAL_COMMUNITY)
Admission: RE | Admit: 2023-04-18 | Discharge: 2023-04-18 | Disposition: A | Discharge status: Home / Self Care | Payer: Medicare PPO | Source: Ambulatory Visit | Attending: Urology | Admitting: Urology

## 2023-04-18 DIAGNOSIS — C61 Malignant neoplasm of prostate: Secondary | ICD-10-CM | POA: Diagnosis present

## 2023-04-18 DIAGNOSIS — C778 Secondary and unspecified malignant neoplasm of lymph nodes of multiple regions: Secondary | ICD-10-CM | POA: Insufficient documentation

## 2023-04-18 DIAGNOSIS — C7951 Secondary malignant neoplasm of bone: Secondary | ICD-10-CM | POA: Diagnosis present

## 2023-04-18 MED ORDER — PIFLIFOLASTAT F 18 (PYLARIFY) INJECTION
9.0000 | Freq: Once | INTRAVENOUS | Status: AC
  Administered 2023-04-18: 9.73 via INTRAVENOUS

## 2023-06-26 ENCOUNTER — Encounter (HOSPITAL_COMMUNITY): Payer: Self-pay

## 2023-06-26 ENCOUNTER — Emergency Department (HOSPITAL_COMMUNITY)
Admission: EM | Admit: 2023-06-26 | Discharge: 2023-06-27 | Disposition: A | Discharge status: Home / Self Care | Payer: Medicare PPO | Attending: Emergency Medicine | Admitting: Emergency Medicine

## 2023-06-26 DIAGNOSIS — R339 Retention of urine, unspecified: Secondary | ICD-10-CM | POA: Insufficient documentation

## 2023-06-26 DIAGNOSIS — R1030 Lower abdominal pain, unspecified: Secondary | ICD-10-CM | POA: Diagnosis not present

## 2023-06-26 DIAGNOSIS — Z8572 Personal history of non-Hodgkin lymphomas: Secondary | ICD-10-CM | POA: Insufficient documentation

## 2023-06-26 DIAGNOSIS — Z8546 Personal history of malignant neoplasm of prostate: Secondary | ICD-10-CM | POA: Insufficient documentation

## 2023-06-26 NOTE — ED Notes (Signed)
Pt was called for triage, pt walked outside to move his car

## 2023-06-26 NOTE — ED Triage Notes (Signed)
Pt arrived POV for urinary issues, pt seen at [facility] and had a scope done. Pt states since 6 pm has been experiencing urinary retention, can only dribble.  Pt reports there were clots during his exam earlier at urology. VSS, A&O x4.

## 2023-06-27 LAB — URINALYSIS, ROUTINE W REFLEX MICROSCOPIC
Bilirubin Urine: NEGATIVE
Glucose, UA: NEGATIVE mg/dL
Ketones, ur: NEGATIVE mg/dL
Nitrite: NEGATIVE
Protein, ur: 100 mg/dL — AB
RBC / HPF: >50 RBC/hpf (ref 0–5)
Specific Gravity, Urine: 1.013 (ref 1.005–1.030)
WBC, UA: >50 WBC/hpf (ref 0–5)
pH: 6 (ref 5.0–8.0)

## 2023-06-27 NOTE — Discharge Instructions (Signed)
You were seen tonight for urinary retention.  A Foley catheter was placed.  Please follow-up with urology for further evaluation and management.

## 2023-06-27 NOTE — ED Provider Notes (Signed)
[place] EMERGENCY DEPARTMENT AT [facility] Provider Note   CSN: [id number] Arrival date & time: 06/26/23  2303     History  Chief Complaint  Patient presents with   Urinary Retention    [name] is a 82 y.o. male.  Patient with past medical history significant for non-Hodgkin lymphoma in remission, prostate cancer, chronic prostatitis presents to the emergency department complaining of urinary retention.  He states that this morning he was seen by urology for a cystoscopy due to recent episodes of blood in the urine.  He states that since going home he has been passing some blood clots in his urine.  He states that he has been unable to urinate since 6 PM.  His urinalysis at urology reportedly showed some signs of possible urinary tract infection and he was prescribed Keflex.  He denies nausea, vomiting, severe abdominal pain.  He does endorse feeling the need to urinate.  HPI     Home Medications Prior to Admission medications   Medication Sig Start Date End Date Taking? Authorizing Provider  Cholecalciferol (VITAMIN D3) 1000 units CAPS Take 2 capsules by mouth daily.    [provider]  latanoprost (XALATAN) 0.005 % ophthalmic solution Place 1 drop into both eyes at bedtime.     [provider]  Multiple Vitamin (MULTIVITAMIN) capsule Take 1 capsule by mouth daily.    [provider]  vitamin C (ASCORBIC ACID) 500 MG tablet Take 1,500 mg by mouth daily.    [provider]  Zinc Sulfate 220 (50 ZN) MG TABS Take 1 tablet by mouth daily.    [provider]      Allergies    Amoxicillin and Ampicillin    Review of Systems   Review of Systems  Physical Exam Updated Vital Signs BP 136/78 (BP Location: Left Arm)   Pulse 76   Temp 98.8 F (37.1 C) (Oral)   Resp 20   Ht 6' (1.829 m)   Wt 90.7 kg   SpO2 97%   BMI 27.12 kg/m  Physical Exam Vitals and nursing note reviewed.  HENT:     Head: Normocephalic and  atraumatic.  Eyes:     Conjunctiva/sclera: Conjunctivae normal.  Cardiovascular:     Rate and Rhythm: Normal rate.  Pulmonary:     Effort: Pulmonary effort is normal. No respiratory distress.  Abdominal:     Palpations: Abdomen is soft.     Tenderness: There is abdominal tenderness (Mild suprapubic tenderness).  Musculoskeletal:        General: No signs of injury.     Cervical back: Normal range of motion.  Skin:    General: Skin is dry.  Neurological:     Mental Status: He is alert.  Psychiatric:        Speech: Speech normal.        Behavior: Behavior normal.     ED Results / Procedures / Treatments   Labs (all labs ordered are listed, but only abnormal results are displayed) Labs Reviewed  URINALYSIS, ROUTINE W REFLEX MICROSCOPIC - Abnormal; Notable for the following components:      Result Value   APPearance CLOUDY (*)    Hgb urine dipstick LARGE (*)    Protein, ur 100 (*)    Leukocytes,Ua SMALL (*)    Bacteria, UA FEW (*)    All other components within normal limits    EKG None  Radiology No results found.  Procedures Procedures    Medications  Ordered in ED Medications - No data to display  ED Course/ Medical Decision Making/ A&P                                 Medical Decision Making Amount and/or Complexity of Data Reviewed Labs: ordered.   This patient presents to the ED for concern of urinary retention, this involves an extensive number of treatment options, and is a complaint that carries with it a high risk of complications and morbidity.  The differential diagnosis includes bladder outlet obstruction, BPH, neurologic compromise   Co morbidities that complicate the patient evaluation  Prostate cancer   Lab Tests:  I Ordered, and personally interpreted labs.  The pertinent results include: UA with large hemoglobin, small leukocytes, few bacteria   Test / Admission - Considered:  Patient with urinary retention likely due to to a blood  clot causing a bladder outlet obstruction.  Foley catheter was placed with immediate return of urine and patient felt much better.  Plan to discharge home at this time with plans for urologic follow-up.  Return precautions provided.  Discharged home.         Final Clinical Impression(s) / ED Diagnoses Final diagnoses:  Urinary retention    Rx / DC Orders ED Discharge Orders     None         Pamala Duffel 06/27/23 0124    Tilden Fossa, MD 06/27/23 [phone]

## 2023-12-17 ENCOUNTER — Telehealth: Payer: Self-pay | Admitting: Radiation Oncology

## 2023-12-17 NOTE — Telephone Encounter (Signed)
 Sent request to dosimetry team 12/17/23
